# Patient Record
Sex: Female | Born: 1980 | Race: White | Hispanic: Yes | Marital: Married | Smoking: Former smoker
Health system: Southern US, Community
[De-identification: ages and names within clinical notes are randomized; demographics above are authoritative.]

## PROBLEM LIST (undated history)

## (undated) DIAGNOSIS — R5383 Other fatigue: Secondary | ICD-10-CM

## (undated) DIAGNOSIS — C55 Malignant neoplasm of uterus, part unspecified: Secondary | ICD-10-CM

## (undated) DIAGNOSIS — Z8639 Personal history of other endocrine, nutritional and metabolic disease: Secondary | ICD-10-CM

## (undated) DIAGNOSIS — Z862 Personal history of diseases of the blood and blood-forming organs and certain disorders involving the immune mechanism: Secondary | ICD-10-CM

## (undated) DIAGNOSIS — R0602 Shortness of breath: Secondary | ICD-10-CM

## (undated) DIAGNOSIS — C801 Malignant (primary) neoplasm, unspecified: Secondary | ICD-10-CM

## (undated) DIAGNOSIS — N946 Dysmenorrhea, unspecified: Secondary | ICD-10-CM

## (undated) DIAGNOSIS — N840 Polyp of corpus uteri: Secondary | ICD-10-CM

## (undated) DIAGNOSIS — Z5189 Encounter for other specified aftercare: Secondary | ICD-10-CM

## (undated) DIAGNOSIS — D649 Anemia, unspecified: Secondary | ICD-10-CM

## (undated) DIAGNOSIS — R002 Palpitations: Secondary | ICD-10-CM

## (undated) DIAGNOSIS — I10 Essential (primary) hypertension: Secondary | ICD-10-CM

## (undated) HISTORY — DX: Palpitations: R00.2

## (undated) HISTORY — PX: ENDOMETRIAL BIOPSY: SHX622

## (undated) HISTORY — DX: Malignant neoplasm of uterus, part unspecified: C55

## (undated) HISTORY — DX: Encounter for other specified aftercare: Z51.89

## (undated) HISTORY — DX: Polyp of corpus uteri: N84.0

## (undated) HISTORY — DX: Malignant (primary) neoplasm, unspecified: C80.1

## (undated) HISTORY — PX: DILATION AND CURETTAGE OF UTERUS: SHX78

## (undated) HISTORY — DX: Personal history of diseases of the blood and blood-forming organs and certain disorders involving the immune mechanism: Z86.2

## (undated) HISTORY — DX: Dysmenorrhea, unspecified: N94.6

## (undated) HISTORY — DX: Personal history of other endocrine, nutritional and metabolic disease: Z86.39

## (undated) HISTORY — DX: Shortness of breath: R06.02

## (undated) HISTORY — DX: Anemia, unspecified: D64.9

## (undated) HISTORY — DX: Morbid (severe) obesity due to excess calories: E66.01

## (undated) HISTORY — DX: Other fatigue: R53.83

## (undated) HISTORY — DX: Essential (primary) hypertension: I10

---

## 2003-04-30 ENCOUNTER — Ambulatory Visit: Admission: RE | Admit: 2003-04-30 | Discharge: 2003-04-30 | Payer: Self-pay | Admitting: Family Medicine

## 2005-03-18 ENCOUNTER — Emergency Department (HOSPITAL_COMMUNITY): Admission: EM | Admit: 2005-03-18 | Discharge: 2005-03-18 | Payer: Self-pay | Admitting: Emergency Medicine

## 2006-04-29 ENCOUNTER — Emergency Department (HOSPITAL_COMMUNITY): Admission: EM | Admit: 2006-04-29 | Discharge: 2006-04-29 | Payer: Self-pay | Admitting: Emergency Medicine

## 2006-11-08 ENCOUNTER — Emergency Department (HOSPITAL_COMMUNITY): Admission: EM | Admit: 2006-11-08 | Discharge: 2006-11-08 | Payer: Self-pay | Admitting: Emergency Medicine

## 2007-04-03 ENCOUNTER — Emergency Department (HOSPITAL_COMMUNITY): Admission: EM | Admit: 2007-04-03 | Discharge: 2007-04-03 | Payer: Self-pay | Admitting: Family Medicine

## 2008-04-10 ENCOUNTER — Ambulatory Visit (HOSPITAL_COMMUNITY): Admission: RE | Admit: 2008-04-10 | Discharge: 2008-04-10 | Payer: Self-pay | Admitting: Family Medicine

## 2009-03-07 ENCOUNTER — Inpatient Hospital Stay (HOSPITAL_COMMUNITY): Admission: AD | Admit: 2009-03-07 | Discharge: 2009-03-07 | Payer: Self-pay | Admitting: Family Medicine

## 2009-03-13 ENCOUNTER — Ambulatory Visit: Payer: Self-pay | Admitting: Obstetrics and Gynecology

## 2009-03-13 ENCOUNTER — Other Ambulatory Visit: Admission: RE | Admit: 2009-03-13 | Discharge: 2009-03-13 | Payer: Self-pay | Admitting: Obstetrics and Gynecology

## 2009-03-19 ENCOUNTER — Inpatient Hospital Stay (HOSPITAL_COMMUNITY): Admission: AD | Admit: 2009-03-19 | Discharge: 2009-03-21 | Payer: Self-pay | Admitting: Obstetrics & Gynecology

## 2009-03-19 ENCOUNTER — Ambulatory Visit: Payer: Self-pay | Admitting: Obstetrics & Gynecology

## 2009-03-20 ENCOUNTER — Encounter: Payer: Self-pay | Admitting: Obstetrics & Gynecology

## 2009-04-04 ENCOUNTER — Ambulatory Visit: Payer: Self-pay | Admitting: Obstetrics and Gynecology

## 2009-08-18 ENCOUNTER — Emergency Department (HOSPITAL_COMMUNITY): Admission: EM | Admit: 2009-08-18 | Discharge: 2009-08-18 | Payer: Self-pay | Admitting: Emergency Medicine

## 2010-04-29 ENCOUNTER — Emergency Department (HOSPITAL_COMMUNITY)
Admission: EM | Admit: 2010-04-29 | Discharge: 2010-04-29 | Payer: Self-pay | Source: Home / Self Care | Admitting: Emergency Medicine

## 2010-08-01 ENCOUNTER — Ambulatory Visit (HOSPITAL_COMMUNITY): Payer: Self-pay | Attending: Cardiovascular Disease

## 2010-08-01 DIAGNOSIS — R079 Chest pain, unspecified: Secondary | ICD-10-CM | POA: Insufficient documentation

## 2010-08-26 ENCOUNTER — Inpatient Hospital Stay (HOSPITAL_COMMUNITY)
Admission: AD | Admit: 2010-08-26 | Discharge: 2010-08-26 | Disposition: A | Discharge status: Home / Self Care | Payer: Self-pay | Source: Ambulatory Visit | Attending: Obstetrics & Gynecology | Admitting: Obstetrics & Gynecology

## 2010-08-26 ENCOUNTER — Inpatient Hospital Stay (HOSPITAL_COMMUNITY): Payer: Self-pay

## 2010-08-26 DIAGNOSIS — N949 Unspecified condition associated with female genital organs and menstrual cycle: Secondary | ICD-10-CM

## 2010-08-26 DIAGNOSIS — N938 Other specified abnormal uterine and vaginal bleeding: Secondary | ICD-10-CM | POA: Insufficient documentation

## 2010-08-26 DIAGNOSIS — N925 Other specified irregular menstruation: Secondary | ICD-10-CM

## 2010-08-26 LAB — URINE MICROSCOPIC-ADD ON

## 2010-08-26 LAB — URINALYSIS, ROUTINE W REFLEX MICROSCOPIC
Specific Gravity, Urine: 1.02 (ref 1.005–1.030)
Urine Glucose, Fasting: NEGATIVE mg/dL
Urobilinogen, UA: 0.2 mg/dL (ref 0.0–1.0)
pH: 6 (ref 5.0–8.0)

## 2010-08-26 LAB — WET PREP, GENITAL
Clue Cells Wet Prep HPF POC: NONE SEEN
Yeast Wet Prep HPF POC: NONE SEEN

## 2010-08-26 LAB — CBC
MCH: 25.5 pg — ABNORMAL LOW (ref 26.0–34.0)
MCHC: 31.6 g/dL (ref 30.0–36.0)
Platelets: 306 10*3/uL (ref 150–400)
RDW: 15.1 % (ref 11.5–15.5)

## 2010-08-27 LAB — GC/CHLAMYDIA PROBE AMP, GENITAL: GC Probe Amp, Genital: NEGATIVE

## 2010-09-17 ENCOUNTER — Encounter: Payer: Self-pay | Admitting: Obstetrics & Gynecology

## 2010-09-17 LAB — BASIC METABOLIC PANEL
BUN: 9 mg/dL (ref 6–23)
Calcium: 8.8 mg/dL (ref 8.4–10.5)
GFR calc non Af Amer: >60 mL/min (ref >60)
Glucose, Bld: 111 mg/dL — ABNORMAL HIGH (ref 70–99)
Potassium: 3.7 mEq/L (ref 3.5–5.1)

## 2010-09-17 LAB — POCT CARDIAC MARKERS
Myoglobin, poc: 45.2 ng/mL (ref 12–200)
Troponin i, poc: <0.05 ng/mL (ref 0.00–0.09)

## 2010-09-17 LAB — DIFFERENTIAL
Basophils Relative: 0 % (ref 0–1)
Eosinophils Absolute: 0.2 10*3/uL (ref 0.0–0.7)
Monocytes Absolute: 0.7 10*3/uL (ref 0.1–1.0)
Neutrophils Relative %: 68 % (ref 43–77)

## 2010-09-17 LAB — CBC
Hemoglobin: 10.6 g/dL — ABNORMAL LOW (ref 12.0–15.0)
RBC: 4.64 MIL/uL (ref 3.87–5.11)

## 2010-10-03 LAB — CBC
HCT: 27 % — ABNORMAL LOW (ref 36.0–46.0)
HCT: 28.8 % — ABNORMAL LOW (ref 36.0–46.0)
Hemoglobin: 6.4 g/dL — CL (ref 12.0–15.0)
Hemoglobin: 8.6 g/dL — ABNORMAL LOW (ref 12.0–15.0)
Hemoglobin: 8.8 g/dL — ABNORMAL LOW (ref 12.0–15.0)
MCHC: 31.3 g/dL (ref 30.0–36.0)
MCV: 64 fL — ABNORMAL LOW (ref 78.0–100.0)
Platelets: 290 10*3/uL (ref 150–400)
Platelets: 307 10*3/uL (ref 150–400)
Platelets: 308 10*3/uL (ref 150–400)
RBC: 3.19 MIL/uL — ABNORMAL LOW (ref 3.87–5.11)
RBC: 4.04 MIL/uL (ref 3.87–5.11)
RBC: 4.32 MIL/uL (ref 3.87–5.11)
RDW: 23.1 % — ABNORMAL HIGH (ref 11.5–15.5)
RDW: 23.5 % — ABNORMAL HIGH (ref 11.5–15.5)
RDW: 18.6 % — ABNORMAL HIGH (ref 11.5–15.5)
WBC: 11 10*3/uL — ABNORMAL HIGH (ref 4.0–10.5)
WBC: 10.4 10*3/uL (ref 4.0–10.5)

## 2010-10-03 LAB — DIC (DISSEMINATED INTRAVASCULAR COAGULATION)PANEL
D-Dimer, Quant: <0.22 ug/mL-FEU (ref 0.00–0.48)
Platelets: 309 10*3/uL (ref 150–400)
Smear Review: NONE SEEN

## 2010-10-03 LAB — URINALYSIS, ROUTINE W REFLEX MICROSCOPIC
Bilirubin Urine: NEGATIVE
Glucose, UA: NEGATIVE mg/dL
Ketones, ur: NEGATIVE mg/dL
Protein, ur: 30 mg/dL — AB
Urobilinogen, UA: 0.2 mg/dL (ref 0.0–1.0)

## 2010-10-03 LAB — CROSSMATCH

## 2010-10-03 LAB — WET PREP, GENITAL
Clue Cells Wet Prep HPF POC: NONE SEEN
Yeast Wet Prep HPF POC: NONE SEEN

## 2010-10-03 LAB — VON WILLEBRAND PANEL: Factor-VIII Activity: 128 % (ref 50–150)

## 2010-10-03 LAB — GC/CHLAMYDIA PROBE AMP, GENITAL
Chlamydia, DNA Probe: NEGATIVE
GC Probe Amp, Genital: NEGATIVE

## 2010-10-03 LAB — DIFFERENTIAL
Basophils Absolute: 0 10*3/uL (ref 0.0–0.1)
Basophils Relative: 0 % (ref 0–1)
Eosinophils Relative: 1 % (ref 0–5)
Lymphs Abs: 1.7 10*3/uL (ref 0.7–4.0)

## 2010-10-08 ENCOUNTER — Other Ambulatory Visit (HOSPITAL_COMMUNITY): Payer: Self-pay | Admitting: Family Medicine

## 2010-10-08 DIAGNOSIS — Z1231 Encounter for screening mammogram for malignant neoplasm of breast: Secondary | ICD-10-CM

## 2010-10-14 ENCOUNTER — Ambulatory Visit (HOSPITAL_COMMUNITY)
Admission: RE | Admit: 2010-10-14 | Discharge: 2010-10-14 | Disposition: A | Discharge status: Home / Self Care | Payer: Self-pay | Source: Ambulatory Visit | Attending: Family Medicine | Admitting: Family Medicine

## 2010-10-14 DIAGNOSIS — Z1231 Encounter for screening mammogram for malignant neoplasm of breast: Secondary | ICD-10-CM

## 2011-09-03 DIAGNOSIS — R5383 Other fatigue: Secondary | ICD-10-CM | POA: Insufficient documentation

## 2011-11-27 ENCOUNTER — Ambulatory Visit: Payer: Self-pay | Admitting: Physician Assistant

## 2011-11-27 VITALS — BP 124/83 | HR 66 | Temp 97.7°F | Resp 18 | Ht 65.0 in | Wt 331.0 lb

## 2011-11-27 DIAGNOSIS — D2330 Other benign neoplasm of skin of unspecified part of face: Secondary | ICD-10-CM

## 2011-11-27 DIAGNOSIS — L989 Disorder of the skin and subcutaneous tissue, unspecified: Secondary | ICD-10-CM

## 2011-11-27 MED ORDER — DOXYCYCLINE HYCLATE 100 MG PO TABS: 100.0000 mg | ORAL_TABLET | Freq: Two times a day (BID) | ORAL | Status: AC

## 2011-11-27 NOTE — Progress Notes (Signed)
  Subjective:    Patient ID: Casey King, female    DOB: 1980-11-16, 31 y.o.   MRN: [id number]  HPI Casey King comes in today c/o lesion on left forehead that has been present for 4 days.  Lesion started as a small red bump with 2 small "pin holes" in the middle.  The redness spread outward and lesion became hard and slightly raised. She tried to squeeze something out of it but there was no drainage.  She has no fever or chills, itching or other lesions.   Review of Systems As noted in HPI    Objective:   Physical Exam  Lymphadenopathy:       Head (right side): No preauricular, no posterior auricular and no occipital adenopathy present.       Head (left side): Posterior auricular adenopathy present. No preauricular and no occipital adenopathy present.    She has no cervical adenopathy.  Skin:       PHOTO TAKEN      Assessment & Plan:  Skin lesion, ? Cellulitis vs. Insect bite.  Doxy 100 bid x 7 days Hot compresses  Watch for worsening sx.

## 2012-04-25 ENCOUNTER — Other Ambulatory Visit (HOSPITAL_COMMUNITY): Payer: Self-pay | Admitting: Family Medicine

## 2012-07-11 ENCOUNTER — Encounter (HOSPITAL_COMMUNITY): Payer: Self-pay

## 2012-07-11 ENCOUNTER — Emergency Department (HOSPITAL_COMMUNITY): Payer: Self-pay

## 2012-07-11 ENCOUNTER — Emergency Department (HOSPITAL_COMMUNITY)
Admission: EM | Admit: 2012-07-11 | Discharge: 2012-07-11 | Disposition: A | Discharge status: Home / Self Care | Payer: Self-pay | Attending: Emergency Medicine | Admitting: Emergency Medicine

## 2012-07-11 DIAGNOSIS — B9789 Other viral agents as the cause of diseases classified elsewhere: Secondary | ICD-10-CM | POA: Insufficient documentation

## 2012-07-11 DIAGNOSIS — I10 Essential (primary) hypertension: Secondary | ICD-10-CM | POA: Insufficient documentation

## 2012-07-11 DIAGNOSIS — B349 Viral infection, unspecified: Secondary | ICD-10-CM

## 2012-07-11 DIAGNOSIS — Z8679 Personal history of other diseases of the circulatory system: Secondary | ICD-10-CM | POA: Insufficient documentation

## 2012-07-11 DIAGNOSIS — E039 Hypothyroidism, unspecified: Secondary | ICD-10-CM | POA: Insufficient documentation

## 2012-07-11 DIAGNOSIS — Z862 Personal history of diseases of the blood and blood-forming organs and certain disorders involving the immune mechanism: Secondary | ICD-10-CM | POA: Insufficient documentation

## 2012-07-11 MED ORDER — HYDROCOD POLST-CHLORPHEN POLST 10-8 MG/5ML PO LQCR
5.0000 mL | Freq: Once | ORAL | Status: AC
  Administered 2012-07-11: 5 mL via ORAL
  Filled 2012-07-11: qty 5

## 2012-07-11 MED ORDER — HYDROCODONE-HOMATROPINE 5-1.5 MG/5ML PO SYRP: 5.0000 mL | ORAL_SOLUTION | Freq: Four times a day (QID) | ORAL | Status: DC | PRN

## 2012-07-11 NOTE — ED Notes (Signed)
Seen by Dr. Narda Bonds and DX with flu, rx w/Tamiflu and mucinex. Pt here because of increased chest pain. Can't cough up anything

## 2012-07-11 NOTE — Discharge Instructions (Signed)
Chest x-ray shows no pneumonia.  Increase fluids. Tylenol for fever. Cough syrup prescription

## 2012-07-11 NOTE — ED Provider Notes (Signed)
History     CSN: [id number]  Arrival date & time 07/11/12  1946   First MD Initiated Contact with Patient 07/11/12 2023      Chief Complaint  Patient presents with  . Influenza    (Consider location/radiation/quality/duration/timing/severity/associated sxs/prior treatment) HPI....coughing, weakness, muscular aches, headache for approximately one week, worse the past 24 hours.  Patient has not been drinking fluids.  Nothing makes symptoms better or worse. Severity is mild to moderate. No chest pain, dyspnea, chills, stiff neck  Past Medical History  Diagnosis Date  . Morbid obesity   . Hypertension   . Anemia   . Palpitations   . History of hypothyroidism   . Chest pain   . SOB (shortness of breath)   . Fatigue     History reviewed. No pertinent past surgical history.  Family History  Problem Relation Age of Onset  . Cancer Mother     Breast Cancer  . Hypertension Father   . Diabetes Father     History  Substance Use Topics  . Smoking status: Never Smoker   . Smokeless tobacco: Not on file  . Alcohol Use: No    OB History    Grav Para Term Preterm Abortions TAB SAB Ect Mult Living                  Review of Systems  All other systems reviewed and are negative.    Allergies  Review of patient's allergies indicates no known allergies.  Home Medications   Current Outpatient Rx  Name  Route  Sig  Dispense  Refill  . IBUPROFEN 200 MG PO TABS   Oral   Take 800 mg by mouth daily as needed. FOR PAIN         . METOPROLOL SUCCINATE ER 50 MG PO TB24   Oral   Take 50 mg by mouth every 12 (twelve) hours. Take with or immediately following a meal.         . OSELTAMIVIR PHOSPHATE 75 MG PO CAPS   Oral   Take 75 mg by mouth 2 (two) times daily. FOR 5 DAYS         . HYDROCODONE-HOMATROPINE 5-1.5 MG/5ML PO SYRP   Oral   Take 5 mLs by mouth every 6 (six) hours as needed for cough.   90 mL   0     BP 144/97  Pulse 80  Temp 98.5 F (36.9 C) (Oral)   Resp 20  Ht 5\' 5"  (1.651 m)  Wt 320 lb (145.151 kg)  BMI 53.25 kg/m2  SpO2 99%  LMP 06/26/2012  Physical Exam  Nursing note and vitals reviewed. Constitutional: She is oriented to person, place, and time.       Obese, no acute distress  HENT:  Head: Normocephalic and atraumatic.  Eyes: Conjunctivae normal and EOM are normal. Pupils are equal, round, and reactive to light.  Neck: Normal range of motion. Neck supple.  Cardiovascular: Normal rate, regular rhythm and normal heart sounds.   Pulmonary/Chest: Effort normal and breath sounds normal.  Abdominal: Soft. Bowel sounds are normal.  Musculoskeletal: Normal range of motion.  Neurological: She is alert and oriented to person, place, and time.  Skin: Skin is warm and dry.  Psychiatric: She has a normal mood and affect.    ED Course  Procedures (including critical care time)  Labs Reviewed - No data to display Dg Chest 2 View  07/11/2012  *RADIOLOGY REPORT*  Clinical Data: Cough, chest pain,  shortness of breath.  CHEST - 2 VIEW  Comparison: 08/18/2009  Findings: Lungs clear.  Heart size and pulmonary vascularity normal.  No effusion.  Visualized bones unremarkable.  IMPRESSION: No acute disease   Original Report Authenticated By: D. Andria Rhein, MD      1. Viral syndrome       MDM  Chest x-ray reveals no pneumonia. History and physical consistent with viral syndrome. Rx Hycodan for cough.  Increase fluids. Tylenol for fever        Donnetta Hutching, MD 07/11/12 2141

## 2012-08-23 ENCOUNTER — Encounter: Payer: Self-pay | Admitting: Cardiovascular Disease

## 2013-02-24 ENCOUNTER — Emergency Department (HOSPITAL_COMMUNITY)
Admission: EM | Admit: 2013-02-24 | Discharge: 2013-02-24 | Disposition: A | Discharge status: Home / Self Care | Payer: Self-pay | Attending: Emergency Medicine | Admitting: Emergency Medicine

## 2013-02-24 ENCOUNTER — Encounter (HOSPITAL_COMMUNITY): Payer: Self-pay | Admitting: *Deleted

## 2013-02-24 DIAGNOSIS — S8011XA Contusion of right lower leg, initial encounter: Secondary | ICD-10-CM

## 2013-02-24 DIAGNOSIS — I10 Essential (primary) hypertension: Secondary | ICD-10-CM | POA: Insufficient documentation

## 2013-02-24 DIAGNOSIS — Z79899 Other long term (current) drug therapy: Secondary | ICD-10-CM | POA: Insufficient documentation

## 2013-02-24 DIAGNOSIS — Z8709 Personal history of other diseases of the respiratory system: Secondary | ICD-10-CM | POA: Insufficient documentation

## 2013-02-24 DIAGNOSIS — M79609 Pain in unspecified limb: Secondary | ICD-10-CM | POA: Insufficient documentation

## 2013-02-24 DIAGNOSIS — Z862 Personal history of diseases of the blood and blood-forming organs and certain disorders involving the immune mechanism: Secondary | ICD-10-CM | POA: Insufficient documentation

## 2013-02-24 DIAGNOSIS — Z8679 Personal history of other diseases of the circulatory system: Secondary | ICD-10-CM | POA: Insufficient documentation

## 2013-02-24 DIAGNOSIS — I8390 Asymptomatic varicose veins of unspecified lower extremity: Secondary | ICD-10-CM | POA: Insufficient documentation

## 2013-02-24 DIAGNOSIS — Z8639 Personal history of other endocrine, nutritional and metabolic disease: Secondary | ICD-10-CM | POA: Insufficient documentation

## 2013-02-24 DIAGNOSIS — M79604 Pain in right leg: Secondary | ICD-10-CM

## 2013-02-24 MED ORDER — MELOXICAM 7.5 MG PO TABS: 7.5000 mg | ORAL_TABLET | Freq: Every day | ORAL | Status: DC

## 2013-02-24 NOTE — ED Provider Notes (Signed)
Pt presents with RLE pain on the lateral surface of the mid leg.  Sx are persistent, they were acute in onset.  No fever, no SOB, mild swelling and bruising at the site and around varicose veins.  Likely ruptured varicosity, not DVT  Nsaids, ice packs, f/u PRN.  Medical screening examination/treatment/procedure(s) were conducted as a shared visit with non-physician practitioner(s) and myself.  I personally evaluated the patient during the encounter.  Clinical Impression: ruptured  Varicose vein.     Vida Roller, MD 02/24/13 [id number]

## 2013-02-24 NOTE — ED Notes (Signed)
Pain rt lower lateral leg, onset today when she felt a sudden burning in leg.  Has superficial vein swelling /discomfort.rt lower leg.  No injury

## 2013-02-24 NOTE — ED Notes (Signed)
States she felt a knot in her right lower leg, no know  injury

## 2013-02-24 NOTE — ED Provider Notes (Signed)
CSN: [id number]     Arrival date & time 02/24/13  1455 History   First MD Initiated Contact with Patient 02/24/13 1515     Chief Complaint  Patient presents with  . Leg Pain   (Consider location/radiation/quality/duration/timing/severity/associated sxs/prior Treatment) Patient is a 32 y.o. female presenting with leg pain. The history is provided by the patient.  Leg Pain Location:  Leg Injury: no   Leg location:  R lower leg Pain details:    Quality:  Aching and sharp   Severity:  Moderate   Onset quality:  Sudden   Duration:  1 hour   Timing:  Constant   Progression:  Unchanged Chronicity:  New Dislocation: no   Foreign body present:  No foreign bodies Prior injury to area:  No Worsened by:  Nothing tried Ineffective treatments:  None tried Associated symptoms: no fever and no neck pain    Casey King is a 32 y.o. female who presents to the ED with a know to the right lower leg. There was a sudden onset of burning pain and then she noted bruising to the lateral aspect of the leg.   Past Medical History  Diagnosis Date  . Morbid obesity   . Hypertension   . Anemia   . Palpitations   . History of hypothyroidism   . Chest pain   . SOB (shortness of breath)   . Fatigue    History reviewed. No pertinent past surgical history. Family History  Problem Relation Age of Onset  . Cancer Mother     Breast Cancer  . Hypertension Father   . Diabetes Father    History  Substance Use Topics  . Smoking status: Never Smoker   . Smokeless tobacco: Not on file  . Alcohol Use: No   OB History   Grav Para Term Preterm Abortions TAB SAB Ect Mult Living                 Review of Systems  Constitutional: Negative for fever and chills.  HENT: Negative for neck pain.   Respiratory: Negative for shortness of breath.   Cardiovascular: Negative for chest pain.  Musculoskeletal: Negative for joint swelling.       Leg pain  Skin: Negative for wound.  Psychiatric/Behavioral:  The patient is not nervous/anxious.     Allergies  Review of patient's allergies indicates no known allergies.  Home Medications   Current Outpatient Rx  Name  Route  Sig  Dispense  Refill  . HYDROcodone-homatropine (HYCODAN) 5-1.5 MG/5ML syrup   Oral   Take 5 mLs by mouth every 6 (six) hours as needed for cough.   90 mL   0   . ibuprofen (ADVIL,MOTRIN) 200 MG tablet   Oral   Take 800 mg by mouth daily as needed. FOR PAIN         . metoprolol succinate (TOPROL-XL) 50 MG 24 hr tablet   Oral   Take 50 mg by mouth every 12 (twelve) hours. Take with or immediately following a meal.         . oseltamivir (TAMIFLU) 75 MG capsule   Oral   Take 75 mg by mouth 2 (two) times daily. FOR 5 DAYS          BP 142/106  Pulse 64  Temp(Src) 98.4 F (36.9 C) (Oral)  Resp 20  Ht 5\' 5"  (1.651 m)  Wt 308 lb (139.708 kg)  BMI 51.25 kg/m2  SpO2 100% Physical Exam  Nursing note  and vitals reviewed. Constitutional: She is oriented to person, place, and time. She appears well-developed and well-nourished. No distress.  HENT:  Head: Normocephalic.  Eyes: EOM are normal.  Neck: Neck supple.  Cardiovascular: Normal rate.   Pulmonary/Chest: Effort normal.  Musculoskeletal:       Right lower leg: She exhibits tenderness.       Legs: Ecchymosis noted lateral aspect of the right lower leg with tenderness of the area on palpation. No calf pain. Adequate circulation, good pulses, good touch sensation.   Neurological: She is alert and oriented to person, place, and time. She has normal strength. No cranial nerve deficit or sensory deficit. Gait normal.  Skin: Skin is warm and dry.  Psychiatric: She has a normal mood and affect. Her behavior is normal.    ED Course  Procedures   MDM  32 y.o. female with ecchymosis of right lateral lower leg. Will treat with Mobic and she will follow up wit her PCP. No concern for DVT at this time. Patient stable for discharge without any immediate  complications.  Discussed with the patient and all questioned fully answered. She will return if any problems arise.     [facility] Orlene Och, [place] 02/24/13 1539

## 2013-02-25 NOTE — ED Provider Notes (Signed)
Medical screening examination/treatment/procedure(s) were conducted as a shared visit with non-physician practitioner(s) and myself.  I personally evaluated the patient during the encounter  Please see my separate respective documentation pertaining to this patient encounter   Vida Roller, MD 02/25/13 [phone]

## 2013-07-11 ENCOUNTER — Encounter (HOSPITAL_COMMUNITY): Payer: Self-pay | Admitting: *Deleted

## 2013-07-11 ENCOUNTER — Ambulatory Visit: Payer: Self-pay | Admitting: Adult Health

## 2013-07-11 ENCOUNTER — Inpatient Hospital Stay (HOSPITAL_COMMUNITY): Payer: Self-pay

## 2013-07-11 ENCOUNTER — Ambulatory Visit: Payer: Self-pay | Admitting: Obstetrics & Gynecology

## 2013-07-11 ENCOUNTER — Observation Stay (HOSPITAL_COMMUNITY)
Admission: AD | Admit: 2013-07-11 | Discharge: 2013-07-12 | Disposition: A | Discharge status: Home / Self Care | Payer: Self-pay | Source: Ambulatory Visit | Attending: Obstetrics & Gynecology | Admitting: Obstetrics & Gynecology

## 2013-07-11 DIAGNOSIS — R42 Dizziness and giddiness: Secondary | ICD-10-CM | POA: Insufficient documentation

## 2013-07-11 DIAGNOSIS — N938 Other specified abnormal uterine and vaginal bleeding: Secondary | ICD-10-CM | POA: Insufficient documentation

## 2013-07-11 DIAGNOSIS — D649 Anemia, unspecified: Secondary | ICD-10-CM

## 2013-07-11 DIAGNOSIS — N920 Excessive and frequent menstruation with regular cycle: Principal | ICD-10-CM | POA: Diagnosis present

## 2013-07-11 DIAGNOSIS — N949 Unspecified condition associated with female genital organs and menstrual cycle: Secondary | ICD-10-CM | POA: Insufficient documentation

## 2013-07-11 DIAGNOSIS — N939 Abnormal uterine and vaginal bleeding, unspecified: Secondary | ICD-10-CM

## 2013-07-11 DIAGNOSIS — N925 Other specified irregular menstruation: Secondary | ICD-10-CM

## 2013-07-11 LAB — CBC
HEMATOCRIT: 28.1 % — AB (ref 36.0–46.0)
Hemoglobin: 9.5 g/dL — ABNORMAL LOW (ref 12.0–15.0)
MCH: 26.5 pg (ref 26.0–34.0)
MCHC: 33.8 g/dL (ref 30.0–36.0)
MCV: 78.5 fL (ref 78.0–100.0)
Platelets: 257 10*3/uL (ref 150–400)
RBC: 3.58 MIL/uL — ABNORMAL LOW (ref 3.87–5.11)
RDW: 14.2 % (ref 11.5–15.5)
WBC: 8.4 10*3/uL (ref 4.0–10.5)

## 2013-07-11 LAB — HEMOGLOBIN AND HEMATOCRIT, BLOOD
HEMATOCRIT: 26.7 % — AB (ref 36.0–46.0)
Hemoglobin: 8.9 g/dL — ABNORMAL LOW (ref 12.0–15.0)

## 2013-07-11 MED ORDER — DIPHENHYDRAMINE HCL 25 MG PO CAPS
25.0000 mg | ORAL_CAPSULE | Freq: Once | ORAL | Status: AC
  Administered 2013-07-12: 25 mg via ORAL
  Filled 2013-07-11: qty 1

## 2013-07-11 MED ORDER — LACTATED RINGERS IV SOLN
INTRAVENOUS | Status: DC
Start: 2013-07-11 — End: 2013-07-12
  Administered 2013-07-12: 01:00:00 via INTRAVENOUS

## 2013-07-11 MED ORDER — ACETAMINOPHEN 325 MG PO TABS
650.0000 mg | ORAL_TABLET | Freq: Once | ORAL | Status: AC
  Administered 2013-07-12: 650 mg via ORAL
  Filled 2013-07-11: qty 2

## 2013-07-11 MED ORDER — MEGESTROL ACETATE 40 MG PO TABS
160.0000 mg | ORAL_TABLET | ORAL | Status: AC
  Administered 2013-07-11: 160 mg via ORAL
  Filled 2013-07-11: qty 4

## 2013-07-11 MED ORDER — LACTATED RINGERS IV BOLUS (SEPSIS): 1000.0000 mL | Freq: Once | INTRAVENOUS | Status: DC

## 2013-07-11 MED ORDER — IBUPROFEN 600 MG PO TABS: 600.0000 mg | ORAL_TABLET | Freq: Four times a day (QID) | ORAL | Status: DC | PRN

## 2013-07-11 MED ORDER — LACTATED RINGERS IV SOLN
INTRAVENOUS | Status: DC
  Administered 2013-07-11 (×2): via INTRAVENOUS

## 2013-07-11 MED ORDER — SIMETHICONE 80 MG PO CHEW
80.0000 mg | CHEWABLE_TABLET | Freq: Four times a day (QID) | ORAL | Status: DC | PRN
Start: 2013-07-11 — End: 2013-07-12

## 2013-07-11 MED ORDER — TRAMADOL HCL 50 MG PO TABS: 50.0000 mg | ORAL_TABLET | Freq: Four times a day (QID) | ORAL | Status: DC | PRN

## 2013-07-11 MED ORDER — MEGESTROL ACETATE 40 MG PO TABS
40.0000 mg | ORAL_TABLET | Freq: Three times a day (TID) | ORAL | Status: DC
  Administered 2013-07-12 (×2): 40 mg via ORAL
  Filled 2013-07-11 (×2): qty 1

## 2013-07-11 MED ORDER — LACTATED RINGERS IV SOLN
INTRAVENOUS | Status: DC
  Administered 2013-07-11 (×2): via INTRAVENOUS

## 2013-07-11 NOTE — MAU Note (Signed)
C/o bleeding for past 2 days ; progressively getting worse- heavy bleeding with large clots and severe cramping;

## 2013-07-11 NOTE — H&P (Signed)
Chief Complaint: Vaginal Bleeding   First Provider Initiated Contact with Patient 07/11/13 1557     SUBJECTIVE HPI: Casey King is a 33 y.o. G0P0 who presents to maternity admissions reporting heavy vaginal bleeding starting yesterday.  She reports her periods are usually irregular, sometimes heavy, but she is soaking 1 super tampon every 20 minutes today, reports she has had to wear a towel in addition to the tampon so she would not soak her clothes. She has seen large clots bigger than golf-ball sized today.  A few years ago she had this happen and was admitted to the hospital for a D&C and 4 units of blood.  She reports some fatigue but denies dizziness, chest pain, or palpitations.  She denies vaginal itching/burning, urinary symptoms, h/a, n/v, or fever/chills.     Past Medical History  Diagnosis Date  . Morbid obesity   . Hypertension   . Anemia   . Palpitations   . History of hypothyroidism   . Chest pain   . SOB (shortness of breath)   . Fatigue    Past Surgical History  Procedure Laterality Date  . No past surgeries     History   Social History  . Marital Status: Married    Spouse Name: N/A    Number of Children: N/A  . Years of Education: N/A   Occupational History  . Not on file.   Social History Main Topics  . Smoking status: Never Smoker   . Smokeless tobacco: Not on file  . Alcohol Use: No  . Drug Use: No  . Sexual Activity: Not on file   Other Topics Concern  . Not on file   Social History Narrative  . No narrative on file   No current facility-administered medications on file prior to encounter.   Current Outpatient Prescriptions on File Prior to Encounter  Medication Sig Dispense Refill  . metoprolol succinate (TOPROL-XL) 50 MG 24 hr tablet Take 25 mg by mouth every 12 (twelve) hours. Take with or immediately following a meal.       No Known Allergies  ROS: Pertinent items in HPI  OBJECTIVE Blood pressure 118/82, pulse 81, temperature  98.2 F (36.8 C), temperature source Oral, resp. rate 20.  Patient Vitals for the past 24 hrs:  BP Temp Temp src Pulse Resp  07/11/13 1947 118/82 mmHg - - 81 20  07/11/13 1941 115/58 mmHg - - 65 20  07/11/13 1935 110/73 mmHg - - 66 20  07/11/13 1736 109/52 mmHg - - 57 20  07/11/13 1726 111/58 mmHg - - 59 -  07/11/13 1526 117/78 mmHg 98.2 F (36.8 C) Oral - 66   GENERAL: Well-developed, well-nourished female in mild distress.  HEENT: Normocephalic HEART: normal rate, rhythm, heart sounds RESP: normal effort, lung sounds clear and equal bilaterally ABDOMEN: Soft, non-tender EXTREMITIES: Nontender, no edema NEURO: Alert and oriented Pelvic exam: Cervix pink, visually closed, without lesion, large amount of bright/dark red bleeding with golf-ball and larger sized clots x3-4 expressed during pelvic exam, vaginal walls and external genitalia normal Bimanual exam: Cervix 0/long/high, firm, anterior, neg CMT, uterus  Mildly tender, slightly enlarged, difficult to palpate uterus and adnexa r/t body habitus  Soaked 1 pad/hour in MAU  LAB RESULTS Results for orders placed during the hospital encounter of 07/11/13 (from the past 24 hour(s))  CBC     Status: Abnormal   Collection Time    07/11/13  3:55 PM      Result Value Range  WBC 8.4  4.0 - 10.5 K/uL   RBC 3.58 (*) 3.87 - 5.11 MIL/uL   Hemoglobin 9.5 (*) 12.0 - 15.0 g/dL   HCT 28.1 (*) 36.0 - 46.0 %   MCV 78.5  78.0 - 100.0 fL   MCH 26.5  26.0 - 34.0 pg   MCHC 33.8  30.0 - 36.0 g/dL   RDW 14.2  11.5 - 15.5 %   Platelets 257  150 - 400 K/uL  HEMOGLOBIN AND HEMATOCRIT, BLOOD     Status: Abnormal   Collection Time    07/11/13  5:47 PM      Result Value Range   Hemoglobin 8.9 (*) 12.0 - 15.0 g/dL   HCT 26.7 (*) 36.0 - 46.0 %    IMAGING US Transvaginal Non-ob  07/11/2013   CLINICAL DATA:  Heavy vaginal bleeding.  EXAM: TRANSABDOMINAL AND TRANSVAGINAL ULTRASOUND OF PELVIS  TECHNIQUE: Both transabdominal and transvaginal ultrasound  examinations of the pelvis were performed. Transabdominal technique was performed for global imaging of the pelvis including uterus, ovaries, adnexal regions, and pelvic cul-de-sac. It was necessary to proceed with endovaginal exam following the transabdominal exam to visualize the endometrium and adnexa.  COMPARISON:  Pelvic ultrasound 08/26/2010.  FINDINGS: Uterus  Measurements: 9.5 x 5.4 x 6.7 cm. No fibroids or other mass visualized.  Endometrium  Thickness: Heterogeneous and abnormally thickened at 2.3 cm. There is flow within the contents of the endometrial canal on Doppler imaging. No focal abnormality visualized.  Right ovary  Measurements: Not visualized.  Left ovary  Measurements: Not visualized.  Other findings  A small to moderate volume of simple appearing free pelvic fluid is identified.  IMPRESSION: Abnormally thickened endometrium with blood flow within the endometrium on Doppler imaging. Consider endometrial biopsy.  The ovaries are not visualized.  Small to moderate volume of simple appearing free pelvic fluid.   Electronically Signed   By: Inge Rise M.D.   On: 07/11/2013 18:57   US Pelvis Complete  07/11/2013   CLINICAL DATA:  Heavy vaginal bleeding.  EXAM: TRANSABDOMINAL AND TRANSVAGINAL ULTRASOUND OF PELVIS  TECHNIQUE: Both transabdominal and transvaginal ultrasound examinations of the pelvis were performed. Transabdominal technique was performed for global imaging of the pelvis including uterus, ovaries, adnexal regions, and pelvic cul-de-sac. It was necessary to proceed with endovaginal exam following the transabdominal exam to visualize the endometrium and adnexa.  COMPARISON:  Pelvic ultrasound 08/26/2010.  FINDINGS: Uterus  Measurements: 9.5 x 5.4 x 6.7 cm. No fibroids or other mass visualized.  Endometrium  Thickness: Heterogeneous and abnormally thickened at 2.3 cm. There is flow within the contents of the endometrial canal on Doppler imaging. No focal abnormality visualized.   Right ovary  Measurements: Not visualized.  Left ovary  Measurements: Not visualized.  Other findings  A small to moderate volume of simple appearing free pelvic fluid is identified.  IMPRESSION: Abnormally thickened endometrium with blood flow within the endometrium on Doppler imaging. Consider endometrial biopsy.  The ovaries are not visualized.  Small to moderate volume of simple appearing free pelvic fluid.   Electronically Signed   By: Inge Rise M.D.   On: 07/11/2013 18:57    ASSESSMENT 1. Abnormal uterine bleeding (AUB)   2. Menorrhagia   3. Postural dizziness     PLAN LR x2000 ml in MAU Consult Dr Gala Romney.  Dr Gala Romney to bedside to evaluate pt. Megace 160 mg PO ordered     Medication List    ASK your doctor about these medications  KRILL OIL PO  Take 1 capsule by mouth daily.     metoprolol succinate 50 MG 24 hr tablet  Commonly known as:  TOPROL-XL  Take 25 mg by mouth every 12 (twelve) hours. Take with or immediately following a meal.     montelukast 10 MG tablet  Commonly known as:  SINGULAIR  Take 10 mg by mouth at bedtime.         Fatima Blank Certified Nurse-Midwife 07/11/2013  8:12 PM    Pt seen and examined.  Pt's pad is 1/4 soaked after past 2 hours.  Pt is pale and feels dizzy.  Will admit patient for 2 units PRBC and Megace.  Pt needs out patient endometrial biopsy and needs help with signing up for insurance through the exchanges.  Will consult SW to help with this.  Will also check TSH (history of hyperthroid) and prolactin.  Hgb A1c  Arzell Mcgeehan H.

## 2013-07-11 NOTE — MAU Provider Note (Addendum)
Chief Complaint: Vaginal Bleeding   First Provider Initiated Contact with Patient 07/11/13 1557     SUBJECTIVE HPI: Casey King is a 33 y.o. G0P0 who presents to maternity admissions reporting heavy vaginal bleeding starting yesterday.  She reports her periods are usually irregular, sometimes heavy, but she is soaking 1 super tampon every 20 minutes today, reports she has had to wear a towel in addition to the tampon so she would not soak her clothes. She has seen large clots bigger than golf-ball sized today.  A few years ago she had this happen and was admitted to the hospital for a D&C and 4 units of blood.  She reports some fatigue but denies dizziness, chest pain, or palpitations.  She denies vaginal itching/burning, urinary symptoms, h/a, n/v, or fever/chills.     Past Medical History  Diagnosis Date  . Morbid obesity   . Hypertension   . Anemia   . Palpitations   . History of hypothyroidism   . Chest pain   . SOB (shortness of breath)   . Fatigue    Past Surgical History  Procedure Laterality Date  . No past surgeries     History   Social History  . Marital Status: Married    Spouse Name: N/A    Number of Children: N/A  . Years of Education: N/A   Occupational History  . Not on file.   Social History Main Topics  . Smoking status: Never Smoker   . Smokeless tobacco: Not on file  . Alcohol Use: No  . Drug Use: No  . Sexual Activity: Not on file   Other Topics Concern  . Not on file   Social History Narrative  . No narrative on file   No current facility-administered medications on file prior to encounter.   Current Outpatient Prescriptions on File Prior to Encounter  Medication Sig Dispense Refill  . metoprolol succinate (TOPROL-XL) 50 MG 24 hr tablet Take 25 mg by mouth every 12 (twelve) hours. Take with or immediately following a meal.       No Known Allergies  ROS: Pertinent items in HPI  OBJECTIVE Blood pressure 118/82, pulse 81, temperature  98.2 F (36.8 C), temperature source Oral, resp. rate 20.  Patient Vitals for the past 24 hrs:  BP Temp Temp src Pulse Resp  07/11/13 1947 118/82 mmHg - - 81 20  07/11/13 1941 115/58 mmHg - - 65 20  07/11/13 1935 110/73 mmHg - - 66 20  07/11/13 1736 109/52 mmHg - - 57 20  07/11/13 1726 111/58 mmHg - - 59 -  07/11/13 1526 117/78 mmHg 98.2 F (36.8 C) Oral - 66   GENERAL: Well-developed, well-nourished female in mild distress.  HEENT: Normocephalic HEART: normal rate, rhythm, heart sounds RESP: normal effort, lung sounds clear and equal bilaterally ABDOMEN: Soft, non-tender EXTREMITIES: Nontender, no edema NEURO: Alert and oriented Pelvic exam: Cervix pink, visually closed, without lesion, large amount of bright/dark red bleeding with golf-ball and larger sized clots x3-4 expressed during pelvic exam, vaginal walls and external genitalia normal Bimanual exam: Cervix 0/long/high, firm, anterior, neg CMT, uterus  Mildly tender, slightly enlarged, difficult to palpate uterus and adnexa r/t body habitus  Soaked 1 pad/hour in MAU  LAB RESULTS Results for orders placed during the hospital encounter of 07/11/13 (from the past 24 hour(s))  CBC     Status: Abnormal   Collection Time    07/11/13  3:55 PM      Result Value Range  WBC 8.4  4.0 - 10.5 K/uL   RBC 3.58 (*) 3.87 - 5.11 MIL/uL   Hemoglobin 9.5 (*) 12.0 - 15.0 g/dL   HCT 28.1 (*) 36.0 - 46.0 %   MCV 78.5  78.0 - 100.0 fL   MCH 26.5  26.0 - 34.0 pg   MCHC 33.8  30.0 - 36.0 g/dL   RDW 14.2  11.5 - 15.5 %   Platelets 257  150 - 400 K/uL  HEMOGLOBIN AND HEMATOCRIT, BLOOD     Status: Abnormal   Collection Time    07/11/13  5:47 PM      Result Value Range   Hemoglobin 8.9 (*) 12.0 - 15.0 g/dL   HCT 26.7 (*) 36.0 - 46.0 %    IMAGING US Transvaginal Non-ob  07/11/2013   CLINICAL DATA:  Heavy vaginal bleeding.  EXAM: TRANSABDOMINAL AND TRANSVAGINAL ULTRASOUND OF PELVIS  TECHNIQUE: Both transabdominal and transvaginal ultrasound  examinations of the pelvis were performed. Transabdominal technique was performed for global imaging of the pelvis including uterus, ovaries, adnexal regions, and pelvic cul-de-sac. It was necessary to proceed with endovaginal exam following the transabdominal exam to visualize the endometrium and adnexa.  COMPARISON:  Pelvic ultrasound 08/26/2010.  FINDINGS: Uterus  Measurements: 9.5 x 5.4 x 6.7 cm. No fibroids or other mass visualized.  Endometrium  Thickness: Heterogeneous and abnormally thickened at 2.3 cm. There is flow within the contents of the endometrial canal on Doppler imaging. No focal abnormality visualized.  Right ovary  Measurements: Not visualized.  Left ovary  Measurements: Not visualized.  Other findings  A small to moderate volume of simple appearing free pelvic fluid is identified.  IMPRESSION: Abnormally thickened endometrium with blood flow within the endometrium on Doppler imaging. Consider endometrial biopsy.  The ovaries are not visualized.  Small to moderate volume of simple appearing free pelvic fluid.   Electronically Signed   By: Inge Rise M.D.   On: 07/11/2013 18:57   US Pelvis Complete  07/11/2013   CLINICAL DATA:  Heavy vaginal bleeding.  EXAM: TRANSABDOMINAL AND TRANSVAGINAL ULTRASOUND OF PELVIS  TECHNIQUE: Both transabdominal and transvaginal ultrasound examinations of the pelvis were performed. Transabdominal technique was performed for global imaging of the pelvis including uterus, ovaries, adnexal regions, and pelvic cul-de-sac. It was necessary to proceed with endovaginal exam following the transabdominal exam to visualize the endometrium and adnexa.  COMPARISON:  Pelvic ultrasound 08/26/2010.  FINDINGS: Uterus  Measurements: 9.5 x 5.4 x 6.7 cm. No fibroids or other mass visualized.  Endometrium  Thickness: Heterogeneous and abnormally thickened at 2.3 cm. There is flow within the contents of the endometrial canal on Doppler imaging. No focal abnormality visualized.   Right ovary  Measurements: Not visualized.  Left ovary  Measurements: Not visualized.  Other findings  A small to moderate volume of simple appearing free pelvic fluid is identified.  IMPRESSION: Abnormally thickened endometrium with blood flow within the endometrium on Doppler imaging. Consider endometrial biopsy.  The ovaries are not visualized.  Small to moderate volume of simple appearing free pelvic fluid.   Electronically Signed   By: Inge Rise M.D.   On: 07/11/2013 18:57    ASSESSMENT 1. Abnormal uterine bleeding (AUB)   2. Menorrhagia   3. Postural dizziness     PLAN LR x2000 ml in MAU Consult Dr Gala Romney.  Dr Gala Romney to bedside to evaluate pt. Megace 160 mg PO ordered     Medication List    ASK your doctor about these medications  KRILL OIL PO  Take 1 capsule by mouth daily.     metoprolol succinate 50 MG 24 hr tablet  Commonly known as:  TOPROL-XL  Take 25 mg by mouth every 12 (twelve) hours. Take with or immediately following a meal.     montelukast 10 MG tablet  Commonly known as:  SINGULAIR  Take 10 mg by mouth at bedtime.         Fatima Blank Certified Nurse-Midwife 07/11/2013  8:12 PM    Pt seen and examined.  Pt's pad is 1/4 soaked after past 2 hours.  Pt is pale and feels dizzy.  Will admit patient for 2 units PRBC and Megace.  Pt needs out patient endometrial biopsy and needs help with signing up for insurance through the exchanges.  Will consult SW to help with this.  Chelise Hanger H.

## 2013-07-12 LAB — HEMOGLOBIN A1C
Hgb A1c MFr Bld: 5.6 % (ref <5.7)
Mean Plasma Glucose: 114 mg/dL (ref <117)

## 2013-07-12 LAB — CBC
HEMATOCRIT: 30.2 % — AB (ref 36.0–46.0)
Hemoglobin: 9.9 g/dL — ABNORMAL LOW (ref 12.0–15.0)
MCH: 26.8 pg (ref 26.0–34.0)
MCHC: 32.8 g/dL (ref 30.0–36.0)
MCV: 81.6 fL (ref 78.0–100.0)
Platelets: 234 10*3/uL (ref 150–400)
RBC: 3.7 MIL/uL — ABNORMAL LOW (ref 3.87–5.11)
RDW: 14.9 % (ref 11.5–15.5)
WBC: 6.9 10*3/uL (ref 4.0–10.5)

## 2013-07-12 LAB — TSH: TSH: 2.918 u[IU]/mL (ref 0.350–4.500)

## 2013-07-12 LAB — PROLACTIN: Prolactin: 6.5 ng/mL

## 2013-07-12 LAB — PREPARE RBC (CROSSMATCH)

## 2013-07-12 MED ORDER — MEGESTROL ACETATE 20 MG PO TABS
ORAL_TABLET | ORAL | Status: DC
Start: 2013-07-12 — End: 2013-08-19

## 2013-07-12 MED ORDER — METOPROLOL SUCCINATE ER 25 MG PO TB24
25.0000 mg | ORAL_TABLET | Freq: Two times a day (BID) | ORAL | Status: DC
  Administered 2013-07-12 (×2): 25 mg via ORAL
  Filled 2013-07-12 (×2): qty 1

## 2013-07-12 MED ORDER — MEGESTROL ACETATE 40 MG PO TABS: ORAL_TABLET | ORAL | Status: DC

## 2013-07-12 NOTE — Discharge Summary (Signed)
Physician Discharge Summary  Patient ID: Casey King MRN: [id number] DOB/AGE: [birth year] 33 y.o.  Admit date: 07/11/2013 Discharge date: 07/12/2013  Admission Diagnoses:  33 yo anovulatory female with menorrhagia and symptomatic anemia  Discharge Diagnoses: Sme Active Problems:   Menorrhagia   Discharged Condition: good  Hospital Course: 33 yo female was admitted with menorrhagia and symptomatic anemia.  Pt has long history of anovulatory cycles and menorrhagia.  Pt has not had good follow up due to insurance reasons. Pt has been worked up [employer]: None  Significant Diagnostic Studies: labs: cbc, type and cross blood products, TVUS  Treatments: Blood transfusion x2 units; Megace.  Discharge Exam: Blood pressure 109/71, pulse 62, temperature 98.2 F (36.8 C), temperature source Oral, resp. rate 18, height 5\' 5"  (1.651 m), weight 304 lb (137.893 kg), SpO2 99.00%. General appearance: alert, cooperative and no distress GI: soft, NT, ND Pelvic: brown staining on pad Extremities: Homans sign is negative, no sign of DVT  Disposition: 01-Home or Self Care     Medication List         KRILL OIL PO  Take 1 capsule by mouth daily.     megestrol 40 MG tablet  Commonly known as:  MEGACE  Take 1 tablet by mouth 3 times a day for 3 days, then 2 times a day for 2 days, then once a day until you see the MD.     metoprolol succinate 50 MG 24 hr tablet  Commonly known as:  TOPROL-XL  Take 25 mg by mouth every 12 (twelve) hours. Take with or immediately following a meal.     montelukast 10 MG tablet  Commonly known as:  SINGULAIR  Take 10 mg by mouth at bedtime.         Signed: Jarquez Mestre H. 07/12/2013, 8:04 AM

## 2013-07-12 NOTE — Care Management Note (Signed)
    Page 1 of 1   07/12/2013     1:11:56 PM   CARE MANAGEMENT NOTE 07/12/2013  Patient:  Casey, King   Account Number:  [id number]  Date Initiated:  07/12/2013  Documentation initiated by:  Casey King  Subjective/Objective Assessment:   vaginal bleeding     Action/Plan:   blood transfusion   Anticipated DC Date:  07/12/2013   Anticipated DC Plan:  HOME/SELF CARE  In-house referral  Clinical Social Worker      DC Planning Services  Medication Assistance  CM consult        Status of service:  completed  Discharge Disposition:  HOME/SELF CARE  Per UR Regulation:  Reviewed for med. necessity/level of care/duration of stay   Comments:  07/12/13 1100 Casey King Casey King BSN # [phone]- received call from the Casey Casey King on unit requesting CM see patient regarding home medication for discharge plans. Patient is self pay. Patient states can afford medication at [facility] if not too expensive.  Dr. Gala King plans to send patient home on Megace 20 mg ( which is on the 4$ List ) at [facility].  Casey King called Casey King to verify that medication has been called in and the cost is 8$ - because of quanity called in.  CM went to patient's room and patient verified that she could afford $8 and has no problem paying for her prescription today at discharge.  No other needs identified at this time.

## 2013-07-12 NOTE — Progress Notes (Signed)
Ur chart review completed.  

## 2013-07-12 NOTE — Discharge Instructions (Signed)

## 2013-07-12 NOTE — Progress Notes (Signed)
CSW provided pt with the contact number [phone] that can assist her with completing an application or insurance.  Pt told CSW that her sister know of a place in the area that will assist individuals in the community with applications, as well.  Pt thanked CSW for information & plans to follow up upon discharged.

## 2013-07-13 LAB — TYPE AND SCREEN
ABO/RH(D): O POS
Antibody Screen: NEGATIVE
Unit division: 0
Unit division: 0

## 2013-08-03 ENCOUNTER — Other Ambulatory Visit (HOSPITAL_COMMUNITY)
Admission: RE | Admit: 2013-08-03 | Discharge: 2013-08-03 | Disposition: A | Discharge status: Home / Self Care | Payer: Self-pay | Source: Ambulatory Visit | Attending: Nurse Practitioner | Admitting: Nurse Practitioner

## 2013-08-03 ENCOUNTER — Ambulatory Visit (INDEPENDENT_AMBULATORY_CARE_PROVIDER_SITE_OTHER): Payer: Self-pay | Admitting: Nurse Practitioner

## 2013-08-03 ENCOUNTER — Encounter: Payer: Self-pay | Admitting: Nurse Practitioner

## 2013-08-03 VITALS — BP 141/90 | HR 64 | Temp 98.1°F | Ht 65.0 in | Wt 306.3 lb

## 2013-08-03 DIAGNOSIS — N949 Unspecified condition associated with female genital organs and menstrual cycle: Secondary | ICD-10-CM

## 2013-08-03 DIAGNOSIS — N925 Other specified irregular menstruation: Secondary | ICD-10-CM

## 2013-08-03 DIAGNOSIS — N920 Excessive and frequent menstruation with regular cycle: Secondary | ICD-10-CM

## 2013-08-03 DIAGNOSIS — N938 Other specified abnormal uterine and vaginal bleeding: Secondary | ICD-10-CM | POA: Insufficient documentation

## 2013-08-03 DIAGNOSIS — N840 Polyp of corpus uteri: Secondary | ICD-10-CM | POA: Insufficient documentation

## 2013-08-03 DIAGNOSIS — Z01419 Encounter for gynecological examination (general) (routine) without abnormal findings: Secondary | ICD-10-CM

## 2013-08-03 DIAGNOSIS — Z0000 Encounter for general adult medical examination without abnormal findings: Secondary | ICD-10-CM

## 2013-08-03 LAB — POCT PREGNANCY, URINE: PREG TEST UR: NEGATIVE

## 2013-08-03 MED ORDER — NORGESTIMATE-ETH ESTRADIOL 0.25-35 MG-MCG PO TABS: 1.0000 | ORAL_TABLET | Freq: Every day | ORAL | Status: DC

## 2013-08-03 NOTE — Patient Instructions (Signed)
Polycystic Ovarian Syndrome Polycystic ovarian syndrome (PCOS) is a common hormonal disorder among women of reproductive age. Most women with PCOS grow many small cysts on their ovaries. PCOS can cause problems with your periods and make it difficult to get pregnant. It can also cause an increased risk of miscarriage with pregnancy. If left untreated, PCOS can lead to serious health problems, such as diabetes and heart disease. CAUSES The cause of PCOS is not fully understood, but genetics may be a factor. SIGNS AND SYMPTOMS   Infrequent or no menstrual periods.   Inability to get pregnant (infertility) because of not ovulating.   Increased growth of hair on the face, chest, stomach, back, thumbs, thighs, or toes.   Acne, oily skin, or dandruff.   Pelvic pain.   Weight gain or obesity, usually carrying extra weight around the waist.   Type 2 diabetes.   High cholesterol.   High blood pressure.   Female-pattern baldness or thinning hair.   Patches of thickened and dark brown or black skin on the neck, arms, breasts, or thighs.   Tiny excess flaps of skin (skin tags) in the armpits or neck area.   Excessive snoring and having breathing stop at times while asleep (sleep apnea).   Deepening of the voice.   Gestational diabetes when pregnant.  DIAGNOSIS  There is no single test to diagnose PCOS.   Your health care provider will:   Take a medical history.   Perform a pelvic exam.   Have ultrasonography done.   Check your female and female hormone levels.   Measure glucose or sugar levels in the blood.   Do other blood tests.   If you are producing too many female hormones, your health care provider will make sure it is from PCOS. At the physical exam, your health care provider will want to evaluate the areas of increased hair growth. Try to allow natural hair growth for a few days before the visit.   During a pelvic exam, the ovaries may be enlarged  or swollen because of the increased number of small cysts. This can be seen more easily by using vaginal ultrasonography or screening to examine the ovaries and lining of the uterus (endometrium) for cysts. The uterine lining may become thicker if you have not been having a regular period.  TREATMENT  Because there is no cure for PCOS, it needs to be managed to prevent problems. Treatments are based on your symptoms. Treatment is also based on whether you want to have a baby or whether you need contraception.  Treatment may include:   Progesterone hormone to start a menstrual period.   Birth control pills to make you have regular menstrual periods.   Medicines to make you ovulate, if you want to get pregnant.   Medicines to control your insulin.   Medicine to control your blood pressure.   Medicine and diet to control your high cholesterol and triglycerides in your blood.  Medicine to reduce excessive hair growth.  Surgery, making small holes in the ovary, to decrease the amount of female hormone production. This is done through a long, lighted tube (laparoscope) placed into the pelvis through a tiny incision in the lower abdomen.  HOME CARE INSTRUCTIONS  Only take over-the-counter or prescription medicine as directed by your health care provider.  Pay attention to the foods you eat and your activity levels. This can help reduce the effects of PCOS.  Keep your weight under control.  Eat foods that are   low in carbohydrate and high in fiber.  Exercise regularly. SEEK MEDICAL CARE IF:  Your symptoms do not get better with medicine.  You have new symptoms. Document Released: 10/09/2004 Document Revised: 04/05/2013 Document Reviewed: 12/01/2012 [facility] Patient Information 2014 [place], [place]. Endometrial Biopsy Endometrial biopsy is a procedure in which a tissue sample is taken from inside the uterus. The tissue sample is then looked at under a microscope to see if the  tissue is normal or abnormal. The endometrium is the lining of the uterus. This procedure helps determine where you are in your menstrual cycle and how hormone levels are affecting the lining of the uterus. This procedure may also be used to evaluate uterine bleeding or to diagnose endometrial cancer, tuberculosis, polyps, or inflammatory conditions.  LET [facility] CARE PROVIDER KNOW ABOUT:  Any allergies you have.  All medicines you are taking, including vitamins, herbs, eye drops, creams, and over-the-counter medicines.  Previous problems you or members of your family have had with the use of anesthetics.  Any blood disorders you have.  Previous surgeries you have had.  Medical conditions you have.  Possibility of pregnancy. RISKS AND COMPLICATIONS Generally, this is a safe procedure. However, as with any procedure, complications can occur. Possible complications include:  Bleeding.  Pelvic infection.  Puncture of the uterine wall with the biopsy device (rare). BEFORE THE PROCEDURE   Keep a record of your menstrual cycles as directed by your health care provider. You may need to schedule your procedure for a specific time in your cycle.  You may want to bring a sanitary pad to wear home after the procedure.  Arrange for someone to drive you home after the procedure if you will be given a medicine to help you relax (sedative). PROCEDURE   You may be given a sedative to relax you.  You will lie on an exam table with your feet and legs supported as in a pelvic exam.  Your health care provider will insert an instrument (speculum) into your vagina to see your cervix.  Your cervix will be cleansed with an antiseptic solution. A medicine (local anesthetic) will be used to numb the cervix.  A forceps instrument (tenaculum) will be used to hold your cervix steady for the biopsy.  A thin, rodlike instrument (uterine sound) will be inserted through your cervix to determine the  length of your uterus and the location where the biopsy sample will be removed.  A thin, flexible tube (catheter) will be inserted through your cervix and into the uterus. The catheter is used to collect the biopsy sample from your endometrial tissue.  The catheter and speculum will then be removed, and the tissue sample will be sent to a lab for examination. AFTER THE PROCEDURE  You will rest in a recovery area until you are ready to go home.  You may have mild cramping and a small amount of vaginal bleeding for a few days after the procedure. This is normal.  Make sure you find out how to get your test results. Document Released: 10/16/2004 Document Revised: 02/15/2013 Document Reviewed: 11/30/2012 [facility] Patient Information 2014 [place], [place].

## 2013-08-03 NOTE — Progress Notes (Signed)
History:  Casey King a 33 y.o. G0P0 who presents to [facility] clinic today for DUB. She is scheduled today for an Endometrial Biopsy.  She has heavy menses and bled down to 9.9 and 30.2 and was transfused 2 units after passing out in Jan 2014. She has always had irregular and heavy bleeding. She always assumed it was because of her weight. She has recently lost 72 lbs and is interested in becoming pregnant.  She had the same problem 3 years ago and required a D&C. She was seen by Dr Gala Romney in MAU who suggested BCP's. She is willing to start these today. She does have HTN but it is controlled. She can not recall her last pap smear  The following portions of the patient's history were reviewed and updated as appropriate: allergies, current medications, past family history, past medical history, past social history, past surgical history and problem list.  Review of Systems:  Pertinent items are noted in HPI.  Objective:  Physical Exam BP 141/90  Pulse 64  Temp(Src) 98.1 F (36.7 C)  Ht 5\' 5"  (1.651 m)  Wt 306 lb 4.8 oz (138.937 kg)  BMI 50.97 kg/m2 GENERAL: Well-developed, well-nourished female in no acute distress. Morbid Obesity HEENT: Normocephalic, atraumatic.  NECK: Supple. Normal thyroid.  LUNGS: Normal rate. Clear to auscultation bilaterally.  HEART: Regular rate and rhythm with no adventitious sounds.  BREASTS: Symmetric in size. No masses, skin changes, nipple drainage, or lymphadenopathy. ABDOMEN: Soft, nontender, nondistended. No organomegaly. Normal bowel sounds appreciated in all quadrants.  PELVIC: Normal external female genitalia. Vagina is pink and rugated.  Normal discharge. Normal cervix contour. Pap smear obtained. Not able to assess uterus/ adnexa due to size EXTREMITIES: No cyanosis, clubbing, or edema, 2+ distal pulses.  Endometrial Biopsy was obtained without any difficulties. Samples sent. Consent signed  Labs and Imaging US Transvaginal Non-ob  07/11/2013    CLINICAL DATA:  Heavy vaginal bleeding.  EXAM: TRANSABDOMINAL AND TRANSVAGINAL ULTRASOUND OF PELVIS  TECHNIQUE: Both transabdominal and transvaginal ultrasound examinations of the pelvis were performed. Transabdominal technique was performed for global imaging of the pelvis including uterus, ovaries, adnexal regions, and pelvic cul-de-sac. It was necessary to proceed with endovaginal exam following the transabdominal exam to visualize the endometrium and adnexa.  COMPARISON:  Pelvic ultrasound 08/26/2010.  FINDINGS: Uterus  Measurements: 9.5 x 5.4 x 6.7 cm. No fibroids or other mass visualized.  Endometrium  Thickness: Heterogeneous and abnormally thickened at 2.3 cm. There is flow within the contents of the endometrial canal on Doppler imaging. No focal abnormality visualized.  Right ovary  Measurements: Not visualized.  Left ovary  Measurements: Not visualized.  Other findings  A small to moderate volume of simple appearing free pelvic fluid is identified.  IMPRESSION: Abnormally thickened endometrium with blood flow within the endometrium on Doppler imaging. Consider endometrial biopsy.  The ovaries are not visualized.  Small to moderate volume of simple appearing free pelvic fluid.   Electronically Signed   By: Inge Rise M.D.   On: 07/11/2013 18:57      Assessment & Plan:  Assessment:  DUB Morbid Obesity Infertility  Plans:  Endometrial Biopsy Pap smear Start today OrthoCyclen daily. Call if any problem arise with pills Follow up 2 weeks for results and BP check  Olegario Messier, NP 08/03/2013 4:51 PM

## 2013-08-07 ENCOUNTER — Encounter: Payer: Self-pay | Admitting: *Deleted

## 2013-08-14 NOTE — Progress Notes (Signed)
Pt called and stated that she is having severe cramping and she has only taken five days of BCP that was prescribed to her.  Looking at patients chart patient came in on 08/03/13 and was prescribed BCP's (Sprintec) to regulate period due to having irregular periods and that she has a follow up on 09/15/13 with Monna Fam on how BCP's is working.  I advised that it does at least take two to three packs before it has the result controlling her bleeding and that it should help with pain.  I also advised her that she can take ibuprofen 800 mg po q 8 hours for pain and to please continue to take the BCP as prescribed and she will be reevaluated at her visit on 09/15/13.  I informed pt that her pap was normal.  Pt stated understanding with no further questions.

## 2013-08-19 ENCOUNTER — Inpatient Hospital Stay (HOSPITAL_COMMUNITY)
Admission: AD | Admit: 2013-08-19 | Discharge: 2013-08-19 | Disposition: A | Discharge status: Home / Self Care | Payer: Self-pay | Source: Ambulatory Visit | Attending: Obstetrics & Gynecology | Admitting: Obstetrics & Gynecology

## 2013-08-19 ENCOUNTER — Encounter (HOSPITAL_COMMUNITY): Payer: Self-pay

## 2013-08-19 DIAGNOSIS — N949 Unspecified condition associated with female genital organs and menstrual cycle: Secondary | ICD-10-CM | POA: Insufficient documentation

## 2013-08-19 DIAGNOSIS — I10 Essential (primary) hypertension: Secondary | ICD-10-CM | POA: Insufficient documentation

## 2013-08-19 DIAGNOSIS — N938 Other specified abnormal uterine and vaginal bleeding: Secondary | ICD-10-CM | POA: Insufficient documentation

## 2013-08-19 DIAGNOSIS — D649 Anemia, unspecified: Secondary | ICD-10-CM | POA: Insufficient documentation

## 2013-08-19 DIAGNOSIS — R109 Unspecified abdominal pain: Secondary | ICD-10-CM | POA: Insufficient documentation

## 2013-08-19 DIAGNOSIS — Z87891 Personal history of nicotine dependence: Secondary | ICD-10-CM | POA: Insufficient documentation

## 2013-08-19 LAB — CBC
HCT: 28.8 % — ABNORMAL LOW (ref 36.0–46.0)
Hemoglobin: 9 g/dL — ABNORMAL LOW (ref 12.0–15.0)
MCH: 24.4 pg — AB (ref 26.0–34.0)
MCHC: 31.3 g/dL (ref 30.0–36.0)
MCV: 78 fL (ref 78.0–100.0)
PLATELETS: 308 10*3/uL (ref 150–400)
RBC: 3.69 MIL/uL — AB (ref 3.87–5.11)
RDW: 14.1 % (ref 11.5–15.5)
WBC: 9.3 10*3/uL (ref 4.0–10.5)

## 2013-08-19 MED ORDER — MEGESTROL ACETATE 40 MG PO TABS
40.0000 mg | ORAL_TABLET | Freq: Every day | ORAL | Status: DC
  Administered 2013-08-19: 40 mg via ORAL
  Filled 2013-08-19 (×2): qty 1

## 2013-08-19 MED ORDER — MEGESTROL ACETATE 40 MG PO TABS: 40.0000 mg | ORAL_TABLET | Freq: Two times a day (BID) | ORAL | Status: DC

## 2013-08-19 MED ORDER — KETOROLAC TROMETHAMINE 60 MG/2ML IM SOLN
60.0000 mg | Freq: Once | INTRAMUSCULAR | Status: AC
  Administered 2013-08-19: 60 mg via INTRAMUSCULAR
  Filled 2013-08-19: qty 2

## 2013-08-19 NOTE — MAU Provider Note (Signed)
History     CSN: [id number]  Arrival date and time: 08/19/13 1726   First Provider Initiated Contact with Patient 08/19/13 1900      Chief Complaint  Patient presents with  . Vaginal Bleeding   HPI  [name] is a 33 y.o. female G0P0 who presents with heavy vaginal bleeding; pt has a history of DUB.  Pt was seen in the clinic on February 5th and was prescribed birth control pills following an endometrial biopsy. Pt started the birth control pills that Sunday and took them for only 9 days and then stopped them because she started bleeding and cramping. She has not stopped bleeding and cramping since she stopped the pills. She is changing her pad/tampon every 30 mins to 1 hour.  Pt is having abdominal cramping; has not taken anything for pain today.   OB History   Grav Para Term Preterm Abortions TAB SAB Ect Mult Living   0               Past Medical History  Diagnosis Date  . Morbid obesity   . Hypertension   . Anemia   . Palpitations   . History of hypothyroidism   . Chest pain   . SOB (shortness of breath)   . Fatigue     Past Surgical History  Procedure Laterality Date  . Endometrial biopsy    . Dilation and curettage of uterus      Family History  Problem Relation Age of Onset  . Cancer Mother     Breast Cancer  . Hypertension Father   . Diabetes Father   . Cancer Sister   . Cancer Maternal Grandmother     History  Substance Use Topics  . Smoking status: Former Research scientist (life sciences)  . Smokeless tobacco: Never Used  . Alcohol Use: No    Allergies: No Known Allergies  Prescriptions prior to admission  Medication Sig Dispense Refill  . ferrous sulfate 325 (65 FE) MG tablet Take 325 mg by mouth daily with breakfast.      . KRILL OIL PO Take 1 capsule by mouth daily.      . megestrol (MEGACE) 20 MG tablet Take 2 tablet by mouth 3 times a day for 3 days, then 2 tablets a day for 2 days, then 2 tablets once a day until you see the MD.  60 tablet  0  .  metoprolol succinate (TOPROL-XL) 50 MG 24 hr tablet Take 25 mg by mouth every 12 (twelve) hours. Take with or immediately following a meal.      . montelukast (SINGULAIR) 10 MG tablet Take 10 mg by mouth at bedtime.      . norgestimate-ethinyl estradiol (ORTHO-CYCLEN,SPRINTEC,PREVIFEM) 0.25-35 MG-MCG tablet Take 1 tablet by mouth daily.  1 Package  3   Results for orders placed during the hospital encounter of 08/19/13 (from the past 48 hour(s))  CBC     Status: Abnormal   Collection Time    08/19/13  8:30 AM      Result Value Ref Range   WBC 9.3  4.0 - 10.5 K/uL   RBC 3.69 (*) 3.87 - 5.11 MIL/uL   Hemoglobin 9.0 (*) 12.0 - 15.0 g/dL   HCT 28.8 (*) 36.0 - 46.0 %   MCV 78.0  78.0 - 100.0 fL   MCH 24.4 (*) 26.0 - 34.0 pg   MCHC 31.3  30.0 - 36.0 g/dL   RDW 14.1  11.5 - 15.5 %   Platelets  308  150 - 400 K/uL     Review of Systems  Gastrointestinal: Positive for abdominal pain (+ cramping ).  Genitourinary: Negative for dysuria, urgency, frequency, hematuria and flank pain.       No vaginal discharge. + vaginal bleeding; heavy, dark red blood with some clots  No dysuria.    Physical Exam   Blood pressure 141/86, pulse 84, temperature 98.2 F (36.8 C), temperature source Oral, resp. rate 18, height 5\' 5"  (1.651 m), weight 140.218 kg (309 lb 2 oz), SpO2 100.00%.  Physical Exam  Constitutional: She is oriented to person, place, and time. She appears well-developed and well-nourished. No distress.  HENT:  Head: Normocephalic.  Eyes: Pupils are equal, round, and reactive to light.  Neck: Neck supple.  Respiratory: Effort normal.  GI: Soft. She exhibits no distension and no mass. There is no tenderness. There is no rebound and no guarding.  Genitourinary:  Speculum exam: Vagina - Moderate amount of dark red blood in vaginal canal; 1 small blood clot noted  Cervix - Moderate amount of active bleeding  Bimanual exam: Cervix closed Uterus non tender, normal size Adnexa non tender,  no masses bilaterally Chaperone present for exam.   Musculoskeletal: Normal range of motion.  Neurological: She is alert and oriented to person, place, and time.  Skin: Skin is warm. She is not diaphoretic.  Psychiatric: Her behavior is normal.    MAU Course  Procedures  MDM CBC  Toradol 60 mg IM; pain improved following injection Megace 40 mg tab given in MAU  Consulted with Dr. Ihor Dow  Assessment and Plan   A:  1. DUB (dysfunctional uterine bleeding)   2.  Anemia   P:  Discharge home in stable condition Continue taking iron supplements  RX: Megace  Bleeding precautions discussed Keep your clinic appointment in March Return to MAU as needed if symptoms worsen, warning signs discussed   Darrelyn Hillock Rasch, NP 08/19/2013, 7:55 PM

## 2013-08-19 NOTE — MAU Provider Note (Signed)
Attestation of Attending Supervision of Advanced Practitioner (CNM/NP): Evaluation and management procedures were performed by the Advanced Practitioner under my supervision and collaboration.  I have reviewed the Advanced Practitioner's note and chart, and I agree with the management and plan.  HARRAWAY-SMITH, Mackinley Cassaday 7:57 PM

## 2013-08-19 NOTE — Discharge Instructions (Signed)

## 2013-08-19 NOTE — MAU Note (Signed)
Pt states has hx of DUB, was seen in clinic and was started on sprintec. Took for a few days and bleeding became worse. Stopped taking bc pills. Has been passing large clots. Has hx of anemia as well.

## 2013-08-29 ENCOUNTER — Ambulatory Visit (INDEPENDENT_AMBULATORY_CARE_PROVIDER_SITE_OTHER): Payer: BC Managed Care – PPO | Admitting: Adult Health

## 2013-08-29 ENCOUNTER — Encounter: Payer: Self-pay | Admitting: Adult Health

## 2013-08-29 VITALS — BP 112/74 | Ht 65.0 in | Wt 311.0 lb

## 2013-08-29 DIAGNOSIS — N920 Excessive and frequent menstruation with regular cycle: Secondary | ICD-10-CM

## 2013-08-29 DIAGNOSIS — N926 Irregular menstruation, unspecified: Secondary | ICD-10-CM

## 2013-08-29 DIAGNOSIS — Z862 Personal history of diseases of the blood and blood-forming organs and certain disorders involving the immune mechanism: Secondary | ICD-10-CM

## 2013-08-29 DIAGNOSIS — Z3200 Encounter for pregnancy test, result unknown: Secondary | ICD-10-CM

## 2013-08-29 DIAGNOSIS — Z3202 Encounter for pregnancy test, result negative: Secondary | ICD-10-CM

## 2013-08-29 HISTORY — DX: Personal history of diseases of the blood and blood-forming organs and certain disorders involving the immune mechanism: Z86.2

## 2013-08-29 LAB — POCT URINE PREGNANCY: PREG TEST UR: NEGATIVE

## 2013-08-29 MED ORDER — MEGESTROL ACETATE 40 MG PO TABS: ORAL_TABLET | ORAL | Status: DC

## 2013-08-29 NOTE — Progress Notes (Signed)
Subjective:     Patient ID: Casey King, female   DOB: 1981-01-02, 33 y.o.   MRN: [id number]  HPI Treina is a 33 year old white female, married in for heavy bleeding, was seen at [facility] clinic and had pap, [place] and endo biopsy, which showed benign endometrial polyp, pap was normal with negative HPV. Her HGB was 8.9 and she got 2 units PRBC, last HGB 9 on 08/19/13.On megace now and bleeding almost stopped.Has history of abnormal bleeding in past and had D&C.She does not have children and would like one.She has lost 65 lbs in last 3 years or so.  Review of Systems See HPI Reviewed past medical,surgical, social and family history. Reviewed medications and allergies.     Objective:   Physical Exam BP 112/74  Ht 5\' 5"  (1.651 m)  Wt 311 lb (141.069 kg)  BMI 51.75 kg/m2 Skin warm and dry.Pelvic: external genitalia is normal in appearance, vagina: scant discharge without odor, cervix:smooth, uterus: normal size, shape and contour, non tender, no masses felt, adnexa: no masses or tenderness noted.Reviewed prior labs,pap,endo biopsy results and [place] with pt.    Assessment:     Menorrhagia History of anemia     Plan:     Rx megace 3 x 5 days then 2 daily #60 with 1 refill Take iron tabs Check CBC,CMP and anemia profile Follow up in 4 weeks Review handout on menorrhagia

## 2013-08-29 NOTE — Patient Instructions (Signed)
Menorrhagia Menorrhagia is the medical term for when your menstrual periods are heavy or last longer than usual. With menorrhagia, every period you have may cause enough blood loss and cramping that you are unable to maintain your usual activities. CAUSES  In some cases, the cause of heavy periods is unknown, but a number of conditions may cause menorrhagia. Common causes include:  A problem with the hormone-producing thyroid gland (hypothyroid).  Noncancerous growths in the uterus (polyps or fibroids).  An imbalance of the estrogen and progesterone hormones.  One of your ovaries not releasing an egg during one or more months.  Side effects of having an intrauterine device (IUD).  Side effects of some medicines, such as anti-inflammatory medicines or blood thinners.  A bleeding disorder that stops your blood from clotting normally. SIGNS AND SYMPTOMS  During a normal period, bleeding lasts between 4 and 8 days. Signs that your periods are too heavy include:  You routinely have to change your pad or tampon every 1 or 2 hours because it is completely soaked.  You pass blood clots larger than 1 inch (2.5 cm) in size.  You have bleeding for more than 7 days.  You need to use pads and tampons at the same time because of heavy bleeding.  You need to wake up to change your pads or tampons during the night.  You have symptoms of anemia, such as tiredness, fatigue, or shortness of breath. DIAGNOSIS  Your health care provider will perform a physical exam and ask you questions about your symptoms and menstrual history. Other tests may be ordered based on what the health care provider finds during the exam. These tests can include:  Blood tests To check if you are pregnant or have hormonal changes, a bleeding or thyroid disorder, low iron levels (anemia), or other problems.  Endometrial biopsy Your health care provider takes a sample of tissue from the inside of your uterus to be examined  under a microscope.  Pelvic ultrasound This test uses sound waves to make a picture of your uterus, ovaries, and vagina. The pictures can show if you have fibroids or other growths.  Hysteroscopy For this test, your health care provider will use a small telescope to look inside your uterus. Based on the results of your initial tests, your health care provider may recommend further testing. TREATMENT  Treatment may not be needed. If it is needed, your health care provider may recommend treatment with one or more medicines first. If these do not reduce bleeding enough, a surgical treatment might be an option. The best treatment for you will depend on:   Whether you need to prevent pregnancy.  Your desire to have children in the future.  The cause and severity of your bleeding.  Your opinion and personal preference.  Medicines for menorrhagia may include:  Birth control methods that use hormones These include the pill, skin patch, vaginal ring, shots that you get every 3 months, hormonal IUD, and implant. These treatments reduce bleeding during your menstrual period.  Medicines that thicken blood and slow bleeding.  Medicines that reduce swelling, such as ibuprofen.  Medicines that contain a synthetic hormone called progestin.   Medicines that make the ovaries stop working for a short time.  You may need surgical treatment for menorrhagia if the medicines are unsuccessful. Treatment options include:  Dilation and curettage (D&C) In this procedure, your health care provider opens (dilates) your cervix and then scrapes or suctions tissue from the lining of your  uterus to reduce menstrual bleeding.  Operative hysteroscopy This procedure uses a tiny tube with a light (hysteroscope) to view your uterine cavity and can help in the surgical removal of a polyp that may be causing heavy periods.  Endometrial ablation Through various techniques, your health care provider permanently  destroys the entire lining of your uterus (endometrium). After endometrial ablation, most women have little or no menstrual flow. Endometrial ablation reduces your ability to become pregnant.  Endometrial resection This surgical procedure uses an electrosurgical wire loop to remove the lining of the uterus. This procedure also reduces your ability to become pregnant.  Hysterectomy Surgical removal of the uterus and cervix is a permanent procedure that stops menstrual periods. Pregnancy is not possible after a hysterectomy. This procedure requires anesthesia and hospitalization. HOME CARE INSTRUCTIONS   Only take over-the-counter or prescription medicines as directed by your health care provider. Take prescribed medicines exactly as directed. Do not change or switch medicines without consulting your health care provider.  Take any prescribed iron pills exactly as directed by your health care provider. Long-term heavy bleeding may result in low iron levels. Iron pills help replace the iron your body lost from heavy bleeding. Iron may cause constipation. If this becomes a problem, increase the bran, fruits, and roughage in your diet.  Do not take aspirin or medicines that contain aspirin 1 week before or during your menstrual period. Aspirin may make the bleeding worse.  If you need to change your sanitary pad or tampon more than once every 2 hours, stay in bed and rest as much as possible until the bleeding stops.  Eat well-balanced meals. Eat foods high in iron. Examples are leafy green vegetables, meat, liver, eggs, and whole grain breads and cereals. Do not try to lose weight until the abnormal bleeding has stopped and your blood iron level is back to normal. SEEK MEDICAL CARE IF:   You soak through a pad or tampon every 1 or 2 hours, and this happens every time you have a period.  You need to use pads and tampons at the same time because you are bleeding so much.  You need to change your pad  or tampon during the night.  You have a period that lasts for more than 8 days.  You pass clots bigger than 1 inch wide.  You have irregular periods that happen more or less often than once a month.  You feel dizzy or faint.  You feel very weak or tired.  You feel short of breath or feel your heart is beating too fast when you exercise.  You have nausea and vomiting or diarrhea while you are taking your medicine.  You have any problems that may be related to the medicine you are taking. SEEK IMMEDIATE MEDICAL CARE IF:   You soak through 4 or more pads or tampons in 2 hours.  You have any bleeding while you are pregnant. MAKE SURE YOU:   Understand these instructions.  Will watch your condition.  Will get help right away if you are not doing well or get worse. Document Released: 06/15/2005 Document Revised: 04/05/2013 Document Reviewed: 12/04/2012 [facility] Patient Information 2014 [address]. Take megace 3 x 5 days then 2 daily  Return in 4 weeks

## 2013-08-29 NOTE — Addendum Note (Signed)
Addended by: Derrek Monaco A on: 08/29/2013 02:03 PM   Modules accepted: Orders

## 2013-08-30 LAB — IRON AND TIBC
%SAT: 27 % (ref 20–55)
Iron: 107 ug/dL (ref 42–145)
TIBC: 390 ug/dL (ref 250–470)
UIBC: 283 ug/dL (ref 125–400)

## 2013-08-30 LAB — COMPREHENSIVE METABOLIC PANEL
ALBUMIN: 4 g/dL (ref 3.5–5.2)
ALK PHOS: 24 U/L — AB (ref 39–117)
ALT: 10 U/L (ref 0–35)
AST: 9 U/L (ref 0–37)
BUN: 11 mg/dL (ref 6–23)
CO2: 24 mEq/L (ref 19–32)
Calcium: 8.8 mg/dL (ref 8.4–10.5)
Chloride: 108 mEq/L (ref 96–112)
Creat: 0.64 mg/dL (ref 0.50–1.10)
GLUCOSE: 88 mg/dL (ref 70–99)
POTASSIUM: 4.1 meq/L (ref 3.5–5.3)
SODIUM: 139 meq/L (ref 135–145)
TOTAL PROTEIN: 6.3 g/dL (ref 6.0–8.3)
Total Bilirubin: 0.3 mg/dL (ref 0.2–1.2)

## 2013-08-30 LAB — CBC
HCT: 29.2 % — ABNORMAL LOW (ref 36.0–46.0)
HEMOGLOBIN: 8.9 g/dL — AB (ref 12.0–15.0)
MCH: 22.9 pg — ABNORMAL LOW (ref 26.0–34.0)
MCHC: 30.5 g/dL (ref 30.0–36.0)
MCV: 75.3 fL — ABNORMAL LOW (ref 78.0–100.0)
Platelets: 381 10*3/uL (ref 150–400)
RBC: 3.88 MIL/uL (ref 3.87–5.11)
RDW: 14.9 % (ref 11.5–15.5)
WBC: 7.3 10*3/uL (ref 4.0–10.5)

## 2013-08-30 LAB — FERRITIN: Ferritin: 5 ng/mL — ABNORMAL LOW (ref 10–291)

## 2013-08-30 LAB — VITAMIN B12: Vitamin B-12: 376 pg/mL (ref 211–911)

## 2013-08-30 LAB — FOLATE: Folate: 18.9 ng/mL

## 2013-09-01 ENCOUNTER — Telehealth: Payer: Self-pay | Admitting: Adult Health

## 2013-09-01 NOTE — Telephone Encounter (Signed)
Pt aware of labs and bleeding has slowed down a lot, keep appt

## 2013-09-15 ENCOUNTER — Ambulatory Visit: Payer: Self-pay | Admitting: Nurse Practitioner

## 2013-10-02 ENCOUNTER — Ambulatory Visit (INDEPENDENT_AMBULATORY_CARE_PROVIDER_SITE_OTHER): Payer: BC Managed Care – PPO | Admitting: Adult Health

## 2013-10-02 ENCOUNTER — Encounter: Payer: Self-pay | Admitting: Adult Health

## 2013-10-02 VITALS — BP 100/60 | Ht 65.0 in | Wt 314.0 lb

## 2013-10-02 DIAGNOSIS — N920 Excessive and frequent menstruation with regular cycle: Secondary | ICD-10-CM

## 2013-10-02 DIAGNOSIS — N926 Irregular menstruation, unspecified: Secondary | ICD-10-CM

## 2013-10-02 DIAGNOSIS — Z862 Personal history of diseases of the blood and blood-forming organs and certain disorders involving the immune mechanism: Secondary | ICD-10-CM

## 2013-10-02 DIAGNOSIS — N946 Dysmenorrhea, unspecified: Secondary | ICD-10-CM | POA: Insufficient documentation

## 2013-10-02 HISTORY — DX: Dysmenorrhea, unspecified: N94.6

## 2013-10-02 LAB — CBC
HCT: 29.7 % — ABNORMAL LOW (ref 36.0–46.0)
HEMOGLOBIN: 9.1 g/dL — AB (ref 12.0–15.0)
MCH: 23.1 pg — ABNORMAL LOW (ref 26.0–34.0)
MCHC: 30.6 g/dL (ref 30.0–36.0)
MCV: 75.4 fL — ABNORMAL LOW (ref 78.0–100.0)
Platelets: 279 10*3/uL (ref 150–400)
RBC: 3.94 MIL/uL (ref 3.87–5.11)
RDW: 15.8 % — ABNORMAL HIGH (ref 11.5–15.5)
WBC: 7 10*3/uL (ref 4.0–10.5)

## 2013-10-02 NOTE — Patient Instructions (Signed)
Dysmenorrhea Menstrual cramps (dysmenorrhea) are caused by the muscles of the uterus tightening (contracting) during a menstrual period. For some women, this discomfort is merely bothersome. For others, dysmenorrhea can be severe enough to interfere with everyday activities for a few days each month. Primary dysmenorrhea is menstrual cramps that last a couple of days when you start having menstrual periods or soon after. This often begins after a teenager starts having her period. As a woman gets older or has a baby, the cramps will usually lessen or disappear. Secondary dysmenorrhea begins later in life, lasts longer, and the pain may be stronger than primary dysmenorrhea. The pain may start before the period and last a few days after the period.  CAUSES  Dysmenorrhea is usually caused by an underlying problem, such as:  The tissue lining the uterus grows outside of the uterus in other areas of the body (endometriosis).  The endometrial tissue, which normally lines the uterus, is found in or grows into the muscular walls of the uterus (adenomyosis).  The pelvic blood vessels are engorged with blood just before the menstrual period (pelvic congestive syndrome).  Overgrowth of cells (polyps) in the lining of the uterus or cervix.  Falling down of the uterus (prolapse) because of loose or stretched ligaments.  Depression.  Bladder problems, infection, or inflammation.  Problems with the intestine, a tumor, or irritable bowel syndrome.  Cancer of the female organs or bladder.  A severely tipped uterus.  A very tight opening or closed cervix.  Noncancerous tumors of the uterus (fibroids).  Pelvic inflammatory disease (PID).  Pelvic scarring (adhesions) from a previous surgery.  Ovarian cyst.  An intrauterine device (IUD) used for birth control. RISK FACTORS You may be at greater risk of dysmenorrhea if:  You are younger than age 62.  You started puberty early.  You have  irregular or heavy bleeding.  You have never given birth.  You have a family history of this problem.  You are a smoker. SIGNS AND SYMPTOMS   Cramping or throbbing pain in your lower abdomen.  Headaches.  Lower back pain.  Nausea or vomiting.  Diarrhea.  Sweating or dizziness.  Loose stools. DIAGNOSIS  A diagnosis is based on your history, symptoms, physical exam, diagnostic tests, or procedures. Diagnostic tests or procedures may include:  Blood tests.  Ultrasonography.  An examination of the lining of the uterus (dilation and curettage, D&C).  An examination inside your abdomen or pelvis with a scope (laparoscopy).  X-rays.  CT scan.  MRI.  An examination inside the bladder with a scope (cystoscopy).  An examination inside the intestine or stomach with a scope (colonoscopy, gastroscopy). TREATMENT  Treatment depends on the cause of the dysmenorrhea. Treatment may include:  Pain medicine prescribed by your health care provider.  Birth control pills or an IUD with progesterone hormone in it.  Hormone replacement therapy.  Nonsteroidal anti-inflammatory drugs (NSAIDs). These may help stop the production of prostaglandins.  Surgery to remove adhesions, endometriosis, ovarian cyst, or fibroids.  Removal of the uterus (hysterectomy).  Progesterone shots to stop the menstrual period.  Cutting the nerves on the sacrum that go to the female organs (presacral neurectomy).  Electric current to the sacral nerves (sacral nerve stimulation).  Antidepressant medicine.  Psychiatric therapy, counseling, or group therapy.  Exercise and physical therapy.  Meditation and yoga therapy.  Acupuncture. HOME CARE INSTRUCTIONS   Only take over-the-counter or prescription medicines as directed by your health care provider.  Place a heating pad  or hot water bottle on your lower back or abdomen. Do not sleep with the heating pad.  Use aerobic exercises, walking,  swimming, biking, and other exercises to help lessen the cramping.  Massage to the lower back or abdomen may help.  Stop smoking.  Avoid alcohol and caffeine. SEEK MEDICAL CARE IF:   Your pain does not get better with medicine.  You have pain with sexual intercourse.  Your pain increases and is not controlled with medicines.  You have abnormal vaginal bleeding with your period.  You develop nausea or vomiting with your period that is not controlled with medicine. SEEK IMMEDIATE MEDICAL CARE IF:  You pass out.  Document Released: 06/15/2005 Document Revised: 02/15/2013 Document Reviewed: 12/01/2012 [facility] Patient Information 2014 [place]. Menorrhagia Menorrhagia is the medical term for when your menstrual periods are heavy or last longer than usual. With menorrhagia, every period you have may cause enough blood loss and cramping that you are unable to maintain your usual activities. CAUSES  In some cases, the cause of heavy periods is unknown, but a number of conditions may cause menorrhagia. Common causes include:  A problem with the hormone-producing thyroid gland (hypothyroid).  Noncancerous growths in the uterus (polyps or fibroids).  An imbalance of the estrogen and progesterone hormones.  One of your ovaries not releasing an egg during one or more months.  Side effects of having an intrauterine device (IUD).  Side effects of some medicines, such as anti-inflammatory medicines or blood thinners.  A bleeding disorder that stops your blood from clotting normally. SIGNS AND SYMPTOMS  During a normal period, bleeding lasts between 4 and 8 days. Signs that your periods are too heavy include:  You routinely have to change your pad or tampon every 1 or 2 hours because it is completely soaked.  You pass blood clots larger than 1 inch (2.5 cm) in size.  You have bleeding for more than 7 days.  You need to use pads and tampons at the same time because of heavy  bleeding.  You need to wake up to change your pads or tampons during the night.  You have symptoms of anemia, such as tiredness, fatigue, or shortness of breath. DIAGNOSIS  Your health care provider will perform a physical exam and ask you questions about your symptoms and menstrual history. Other tests may be ordered based on what the health care provider finds during the exam. These tests can include:  Blood tests To check if you are pregnant or have hormonal changes, a bleeding or thyroid disorder, low iron levels (anemia), or other problems.  Endometrial biopsy Your health care provider takes a sample of tissue from the inside of your uterus to be examined under a microscope.  Pelvic ultrasound This test uses sound waves to make a picture of your uterus, ovaries, and vagina. The pictures can show if you have fibroids or other growths.  Hysteroscopy For this test, your health care provider will use a small telescope to look inside your uterus. Based on the results of your initial tests, your health care provider may recommend further testing. TREATMENT  Treatment may not be needed. If it is needed, your health care provider may recommend treatment with one or more medicines first. If these do not reduce bleeding enough, a surgical treatment might be an option. The best treatment for you will depend on:   Whether you need to prevent pregnancy.  Your desire to have children in the future.  The cause and  severity of your bleeding.  Your opinion and personal preference.  Medicines for menorrhagia may include:  Birth control methods that use hormones These include the pill, skin patch, vaginal ring, shots that you get every 3 months, hormonal IUD, and implant. These treatments reduce bleeding during your menstrual period.  Medicines that thicken blood and slow bleeding.  Medicines that reduce swelling, such as ibuprofen.  Medicines that contain a synthetic hormone called  progestin.   Medicines that make the ovaries stop working for a short time.  You may need surgical treatment for menorrhagia if the medicines are unsuccessful. Treatment options include:  Dilation and curettage (D&C) In this procedure, your health care provider opens (dilates) your cervix and then scrapes or suctions tissue from the lining of your uterus to reduce menstrual bleeding.  Operative hysteroscopy This procedure uses a tiny tube with a light (hysteroscope) to view your uterine cavity and can help in the surgical removal of a polyp that may be causing heavy periods.  Endometrial ablation Through various techniques, your health care provider permanently destroys the entire lining of your uterus (endometrium). After endometrial ablation, most women have little or no menstrual flow. Endometrial ablation reduces your ability to become pregnant.  Endometrial resection This surgical procedure uses an electrosurgical wire loop to remove the lining of the uterus. This procedure also reduces your ability to become pregnant.  Hysterectomy Surgical removal of the uterus and cervix is a permanent procedure that stops menstrual periods. Pregnancy is not possible after a hysterectomy. This procedure requires anesthesia and hospitalization. HOME CARE INSTRUCTIONS   Only take over-the-counter or prescription medicines as directed by your health care provider. Take prescribed medicines exactly as directed. Do not change or switch medicines without consulting your health care provider.  Take any prescribed iron pills exactly as directed by your health care provider. Long-term heavy bleeding may result in low iron levels. Iron pills help replace the iron your body lost from heavy bleeding. Iron may cause constipation. If this becomes a problem, increase the bran, fruits, and roughage in your diet.  Do not take aspirin or medicines that contain aspirin 1 week before or during your menstrual period.  Aspirin may make the bleeding worse.  If you need to change your sanitary pad or tampon more than once every 2 hours, stay in bed and rest as much as possible until the bleeding stops.  Eat well-balanced meals. Eat foods high in iron. Examples are leafy green vegetables, meat, liver, eggs, and whole grain breads and cereals. Do not try to lose weight until the abnormal bleeding has stopped and your blood iron level is back to normal. SEEK MEDICAL CARE IF:   You soak through a pad or tampon every 1 or 2 hours, and this happens every time you have a period.  You need to use pads and tampons at the same time because you are bleeding so much.  You need to change your pad or tampon during the night.  You have a period that lasts for more than 8 days.  You pass clots bigger than 1 inch wide.  You have irregular periods that happen more or less often than once a month.  You feel dizzy or faint.  You feel very weak or tired.  You feel short of breath or feel your heart is beating too fast when you exercise.  You have nausea and vomiting or diarrhea while you are taking your medicine.  You have any problems that may be related  to the medicine you are taking. SEEK IMMEDIATE MEDICAL CARE IF:   You soak through 4 or more pads or tampons in 2 hours.  You have any bleeding while you are pregnant. MAKE SURE YOU:   Understand these instructions.  Will watch your condition.  Will get help right away if you are not doing well or get worse. Document Released: 06/15/2005 Document Revised: 04/05/2013 Document Reviewed: 12/04/2012 [facility] Patient Information 2014 [place]. Return in 4 days for [place] Continue megace and the iron

## 2013-10-02 NOTE — Progress Notes (Signed)
Subjective:     Patient ID: Sharryn Belding, female   DOB: [birth year], 33 y.o.   MRN: [id number]  HPI Dorathea is back in follow up of bleeding which slowed with megace and has stopped some days but returns at night and she has had cramps and discomfort but declines pain meds.Had biopsy in February which showed benign endometrial polyp.  Review of Systems See HPI Reviewed past medical,surgical, social and family history. Reviewed medications and allergies.     Objective:   Physical Exam BP 100/60  Ht 5\' 5"  (1.651 m)  Wt 314 lb (142.429 kg)  BMI 52.25 kg/m2  LMP 09/28/2013   Skin warm and dry.Pelvic: external genitalia is normal in appearance, vagina: scant discharge without odor, cervix:smooth, but poorly seen, uterus: normal size, shape and contour, non tender, no masses felt, adnexa: no masses or tenderness noted.  Assessment:     Menorrhagia History of anemia Dysmenorrhea Irregular periods    Plan:    Continue iron and megace Check CBC Return in 4 days for [place] to assess lining and ?polyp remnant and see me,if persists discuss with Dr Elonda Husky for possible hysteroscopy and D&C Review handouts

## 2013-10-04 ENCOUNTER — Telehealth: Payer: Self-pay | Admitting: Adult Health

## 2013-10-04 NOTE — Telephone Encounter (Signed)
Pt aware hgb 9.1 keep taking iron and keep appt Friday

## 2013-10-06 ENCOUNTER — Ambulatory Visit (INDEPENDENT_AMBULATORY_CARE_PROVIDER_SITE_OTHER): Payer: BC Managed Care – PPO

## 2013-10-06 ENCOUNTER — Ambulatory Visit (INDEPENDENT_AMBULATORY_CARE_PROVIDER_SITE_OTHER): Payer: BC Managed Care – PPO | Admitting: Adult Health

## 2013-10-06 ENCOUNTER — Encounter: Payer: Self-pay | Admitting: Adult Health

## 2013-10-06 VITALS — BP 120/70 | Ht 65.0 in | Wt 314.6 lb

## 2013-10-06 DIAGNOSIS — N920 Excessive and frequent menstruation with regular cycle: Secondary | ICD-10-CM

## 2013-10-06 DIAGNOSIS — N840 Polyp of corpus uteri: Secondary | ICD-10-CM

## 2013-10-06 DIAGNOSIS — N926 Irregular menstruation, unspecified: Secondary | ICD-10-CM

## 2013-10-06 HISTORY — DX: Polyp of corpus uteri: N84.0

## 2013-10-06 NOTE — Patient Instructions (Signed)
Dilation and Curettage or Vacuum Curettage Dilation and curettage (D&C) and vacuum curettage are minor procedures. A D&C involves stretching (dilation) the cervix and scraping (curettage) the inside lining of the womb (uterus). During a D&C, tissue is gently scraped from the inside lining of the uterus. During a vacuum curettage, the lining and tissue in the uterus are removed with the use of gentle suction.  Curettage may be performed to either diagnose or treat a problem. As a diagnostic procedure, curettage is performed to examine tissues from the uterus. A diagnostic curettage may be performed for the following symptoms:   Irregular bleeding in the uterus.   Bleeding with the development of clots.   Spotting between menstrual periods.   Prolonged menstrual periods.   Bleeding after menopause.   No menstrual period (amenorrhea).   A change in size and shape of the uterus.  As a treatment procedure, curettage may be performed for the following reasons:   Removal of an IUD (intrauterine device).   Removal of retained placenta after giving birth. Retained placenta can cause an infection or bleeding severe enough to require transfusions.   Abortion.   Miscarriage.   Removal of polyps inside the uterus.   Removal of uncommon types of noncancerous lumps (fibroids).  LET [facility] CARE PROVIDER KNOW ABOUT:   Any allergies you have.   All medicines you are taking, including vitamins, herbs, eye drops, creams, and over-the-counter medicines.   Previous problems you or members of your family have had with the use of anesthetics.   Any blood disorders you have.   Previous surgeries you have had.   Medical conditions you have. RISKS AND COMPLICATIONS  Generally, this is a safe procedure. However, as with any procedure, complications can occur. Possible complications include:  Excessive bleeding.   Infection of the uterus.   Damage to the cervix.    Development of scar tissue (adhesions) inside the uterus, later causing abnormal amounts of menstrual bleeding.   Complications from the general anesthetic, if a general anesthetic is used.   Putting a hole (perforation) in the uterus. This is rare.  BEFORE THE PROCEDURE   Eat and drink before the procedure only as directed by your health care provider.   Arrange for someone to take you home.  PROCEDURE  This procedure usually takes about 15 30 minutes.  You will be given one of the following:  A medicine that numbs the area in and around the cervix (local anesthetic).   A medicine to make you sleep through the procedure (general anesthetic).  You will lie on your back with your legs in stirrups.   A warm metal or plastic instrument (speculum) will be placed in your vagina to keep it open and to allow the health care provider to see the cervix.  There are two ways in which your cervix can be softened and dilated. These include:   Taking a medicine.   Having thin rods (laminaria) inserted into your cervix.   A curved tool (curette) will be used to scrape cells from the inside lining of the uterus. In some cases, gentle suction is applied with the curette. The curette will then be removed.  AFTER THE PROCEDURE   You will rest in the recovery area until you are stable and are ready to go home.   You may feel sick to your stomach (nauseous) or throw up (vomit) if you were given a general anesthetic.   You may have a sore throat if a  tube was placed in your throat during general anesthesia.   You may have light cramping and bleeding. This may last for 2 days to 2 weeks after the procedure.   Your uterus needs to make a new lining after the procedure. This may make your next period late. Document Released: 06/15/2005 Document Revised: 02/15/2013 Document Reviewed: 01/12/2013 [facility] Patient Information 2014 [place], [place]. Return in 3 days for pre op with Dr  Elonda Husky Keep taking megace

## 2013-10-06 NOTE — Progress Notes (Signed)
Subjective:     Patient ID: Gracin Soohoo, female   DOB: [birth year], 33 y.o.   MRN: [id number]  HPI Carnell is a  33 year old Hispanic female, in for [place] for bleeding that was slowed by megace but not stopped,had endo BX that showed benign polyp and was given 2 units PRBC at [facility] earlier this year.She desires a child, has never been pregnant.  Review of Systems See HPI Reviewed past medical,surgical, social and family history. Reviewed medications and allergies.     Objective:   Physical Exam BP 120/70  Ht 5\' 5"  (1.651 m)  Wt 314 lb 9.6 oz (142.702 kg)  BMI 52.35 kg/m2  LMP 04/02/2015US reviewed with pt.   Uterus 9.7 x 7.4 x 5.1 cm, no myometrial masses noted  Endometrium 10.0 mm, asymmetrical, with 2 hyperechoic areas noted within cavity #1-21mm and #2-9mm  Right ovary 3.0 x 1.7 x 1.2 cm,  Left ovary 2.4 x 1.2 x 1.2 cm,  No free fluid or adnexal masses noted within pelvis  Technician Comments:  Anteverted uterus noted with 2 hyperechoic areas noted within endometrial cavity, bilateral adnexa/ovaries appears wnl no free fluid or adnexal masses noted within pelvis  Discussed needs D&C will refer to Dr Elonda Husky to do.   Assessment:     Menorrhagia Endometrial polyp    Plan:     Return in 3 days for pre op with Dr Elonda Husky   Review handout on [facility] Keep taking megace 40 mg 2 daily

## 2013-10-09 ENCOUNTER — Encounter (HOSPITAL_COMMUNITY): Payer: Self-pay | Admitting: Pharmacy Technician

## 2013-10-09 ENCOUNTER — Ambulatory Visit (INDEPENDENT_AMBULATORY_CARE_PROVIDER_SITE_OTHER): Payer: BC Managed Care – PPO | Admitting: Obstetrics & Gynecology

## 2013-10-09 ENCOUNTER — Encounter: Payer: Self-pay | Admitting: Obstetrics & Gynecology

## 2013-10-09 VITALS — BP 120/60 | Ht 65.0 in | Wt 311.0 lb

## 2013-10-09 DIAGNOSIS — N840 Polyp of corpus uteri: Secondary | ICD-10-CM

## 2013-10-09 DIAGNOSIS — N920 Excessive and frequent menstruation with regular cycle: Secondary | ICD-10-CM

## 2013-10-09 NOTE — Progress Notes (Signed)
Patient ID: Casey King, female   DOB: [birth year], 33 y.o.   MRN: [id number] Subjective:     Patient ID: Casey King, female   DOB: 1980/07/08, 33 y.o.   MRN: [id number]  Will proceed with hysteroscopy uterine curettage to remove endometrial polyps on 10/25/2013 HPI Casey King is a  33 year old Hispanic female, in for [place] for bleeding that was slowed by megace but not stopped,had endo BX that showed benign polyp and was given 2 units PRBC at [facility] earlier this year.She desires a child, has never been pregnant.  Review of Systems See HPI Reviewed past medical,surgical, social and family history. Reviewed medications and allergies.     Objective:   Physical Exam BP 120/60  Ht 5\' 5"  (1.651 m)  Wt 311 lb (141.069 kg)  BMI 51.75 kg/m2  LMP 04/02/2015US reviewed with pt.   Uterus 9.7 x 7.4 x 5.1 cm, no myometrial masses noted  Endometrium 10.0 mm, asymmetrical, with 2 hyperechoic areas noted within cavity #1-19mm and #2-67mm  Right ovary 3.0 x 1.7 x 1.2 cm,  Left ovary 2.4 x 1.2 x 1.2 cm,  No free fluid or adnexal masses noted within pelvis  Technician Comments:  Anteverted uterus noted with 2 hyperechoic areas noted within endometrial cavity, bilateral adnexa/ovaries appears wnl no free fluid or adnexal masses noted within pelvis  Discussed needs D&C will refer to Dr Elonda Husky to do.   Assessment:     Menorrhagia Endometrial polyp    Plan:     Return in 3 days for pre op with Dr Elonda Husky   Review handout on [facility] Keep taking megace 40 mg 2 daily

## 2013-10-17 NOTE — Patient Instructions (Signed)
Casey King  10/17/2013   Your procedure is scheduled on:   10/25/2013  Report to [facility] at  64  AM.  Call this number if you have problems the morning of surgery: [phone]   Remember:   Do not eat food or drink liquids after midnight.   Take these medicines the morning of surgery with A SIP OF WATER:  metoprolol   Do not wear jewelry, make-up or nail polish.  Do not wear lotions, powders, or perfumes.   Do not shave 48 hours prior to surgery. Men may shave face and neck.  Do not bring valuables to the hospital.  [facility] is not responsible for any belongings or valuables.               Contacts, dentures or bridgework may not be worn into surgery.  Leave suitcase in the car. After surgery it may be brought to your room.  For patients admitted to the hospital, discharge time is determined by your treatment team.               Patients discharged the day of surgery will not be allowed to drive home.  Name and phone number of your driver: family  Special Instructions: Shower using CHG 2 nights before surgery and the night before surgery.  If you shower the day of surgery use CHG.  Use special wash - you have one bottle of CHG for all showers.  You should use approximately 1/3 of the bottle for each shower.   Please read over the following fact sheets that you were given: Pain Booklet, Coughing and Deep Breathing, Surgical Site Infection Prevention, Anesthesia Post-op Instructions and Care and Recovery After Surgery Hysteroscopy Hysteroscopy is a procedure used for looking inside the womb (uterus). It may be done for various reasons, including:  To evaluate abnormal bleeding, fibroid (benign, noncancerous) tumors, polyps, scar tissue (adhesions), and possibly cancer of the uterus.  To look for lumps (tumors) and other uterine growths.  To look for causes of why a woman cannot get pregnant (infertility), causes of recurrent loss of pregnancy (miscarriages), or  a lost intrauterine device (IUD).  To perform a sterilization by blocking the fallopian tubes from inside the uterus. In this procedure, a thin, flexible tube with a tiny light and camera on the end of it (hysteroscope) is used to look inside the uterus. A hysteroscopy should be done right after a menstrual period to be sure you are not pregnant. LET [facility] CARE PROVIDER KNOW ABOUT:   Any allergies you have.  All medicines you are taking, including vitamins, herbs, eye drops, creams, and over-the-counter medicines.  Previous problems you or members of your family have had with the use of anesthetics.  Any blood disorders you have.  Previous surgeries you have had.  Medical conditions you have. RISKS AND COMPLICATIONS  Generally, this is a safe procedure. However, as with any procedure, complications can occur. Possible complications include:  Putting a hole in the uterus.  Excessive bleeding.  Infection.  Damage to the cervix.  Injury to other organs.  Allergic reaction to medicines.  Too much fluid used in the uterus for the procedure. BEFORE THE PROCEDURE   Ask your health care provider about changing or stopping any regular medicines.  Do not take aspirin or blood thinners for 1 week before the procedure, or as directed by your health care provider. These can cause bleeding.  If you smoke, do  not smoke for 2 weeks before the procedure.  In some cases, a medicine is placed in the cervix the day before the procedure. This medicine makes the cervix have a larger opening (dilate). This makes it easier for the instrument to be inserted into the uterus during the procedure.  Do not eat or drink anything for at least 8 hours before the surgery.  Arrange for someone to take you home after the procedure. PROCEDURE   You may be given a medicine to relax you (sedative). You may also be given one of the following:  A medicine that numbs the area around the cervix (local  anesthetic).  A medicine that makes you sleep through the procedure (general anesthetic).  The hysteroscope is inserted through the vagina into the uterus. The camera on the hysteroscope sends a picture to a TV screen. This gives the surgeon a good view inside the uterus.  During the procedure, air or a liquid is put into the uterus, which allows the surgeon to see better.  Sometimes, tissue is gently scraped from inside the uterus. These tissue samples are sent to a lab for testing. AFTER THE PROCEDURE   If you had a general anesthetic, you may be groggy for a couple hours after the procedure.  If you had a local anesthetic, you will be able to go home as soon as you are stable and feel ready.  You may have some cramping. This normally lasts for a couple days.  You may have bleeding, which varies from light spotting for a few days to menstrual-like bleeding for 3 7 days. This is normal.  If your test results are not back during the visit, make an appointment with your health care provider to find out the results. Document Released: 09/21/2000 Document Revised: 04/05/2013 Document Reviewed: 01/12/2013 [facility] Patient Information 2014 [place], [place]. Dilation and Curettage or Vacuum Curettage Dilation and curettage (D&C) and vacuum curettage are minor procedures. A D&C involves stretching (dilation) the cervix and scraping (curettage) the inside lining of the womb (uterus). During a D&C, tissue is gently scraped from the inside lining of the uterus. During a vacuum curettage, the lining and tissue in the uterus are removed with the use of gentle suction.  Curettage may be performed to either diagnose or treat a problem. As a diagnostic procedure, curettage is performed to examine tissues from the uterus. A diagnostic curettage may be performed for the following symptoms:   Irregular bleeding in the uterus.   Bleeding with the development of clots.   Spotting between menstrual  periods.   Prolonged menstrual periods.   Bleeding after menopause.   No menstrual period (amenorrhea).   A change in size and shape of the uterus.  As a treatment procedure, curettage may be performed for the following reasons:   Removal of an IUD (intrauterine device).   Removal of retained placenta after giving birth. Retained placenta can cause an infection or bleeding severe enough to require transfusions.   Abortion.   Miscarriage.   Removal of polyps inside the uterus.   Removal of uncommon types of noncancerous lumps (fibroids).  LET [facility] CARE PROVIDER KNOW ABOUT:   Any allergies you have.   All medicines you are taking, including vitamins, herbs, eye drops, creams, and over-the-counter medicines.   Previous problems you or members of your family have had with the use of anesthetics.   Any blood disorders you have.   Previous surgeries you have had.   Medical conditions you  have. RISKS AND COMPLICATIONS  Generally, this is a safe procedure. However, as with any procedure, complications can occur. Possible complications include:  Excessive bleeding.   Infection of the uterus.   Damage to the cervix.   Development of scar tissue (adhesions) inside the uterus, later causing abnormal amounts of menstrual bleeding.   Complications from the general anesthetic, if a general anesthetic is used.   Putting a hole (perforation) in the uterus. This is rare.  BEFORE THE PROCEDURE   Eat and drink before the procedure only as directed by your health care provider.   Arrange for someone to take you home.  PROCEDURE  This procedure usually takes about 15 30 minutes.  You will be given one of the following:  A medicine that numbs the area in and around the cervix (local anesthetic).   A medicine to make you sleep through the procedure (general anesthetic).  You will lie on your back with your legs in stirrups.   A warm metal or  plastic instrument (speculum) will be placed in your vagina to keep it open and to allow the health care provider to see the cervix.  There are two ways in which your cervix can be softened and dilated. These include:   Taking a medicine.   Having thin rods (laminaria) inserted into your cervix.   A curved tool (curette) will be used to scrape cells from the inside lining of the uterus. In some cases, gentle suction is applied with the curette. The curette will then be removed.  AFTER THE PROCEDURE   You will rest in the recovery area until you are stable and are ready to go home.   You may feel sick to your stomach (nauseous) or throw up (vomit) if you were given a general anesthetic.   You may have a sore throat if a tube was placed in your throat during general anesthesia.   You may have light cramping and bleeding. This may last for 2 days to 2 weeks after the procedure.   Your uterus needs to make a new lining after the procedure. This may make your next period late. Document Released: 06/15/2005 Document Revised: 02/15/2013 Document Reviewed: 01/12/2013 [facility] Patient Information 2014 [place], [place]. PATIENT INSTRUCTIONS POST-ANESTHESIA  IMMEDIATELY FOLLOWING SURGERY:  Do not drive or operate machinery for the first twenty four hours after surgery.  Do not make any important decisions for twenty four hours after surgery or while taking narcotic pain medications or sedatives.  If you develop intractable nausea and vomiting or a severe headache please notify your doctor immediately.  FOLLOW-UP:  Please make an appointment with your surgeon as instructed. You do not need to follow up with anesthesia unless specifically instructed to do so.  WOUND CARE INSTRUCTIONS (if applicable):  Keep a dry clean dressing on the anesthesia/puncture wound site if there is drainage.  Once the wound has quit draining you may leave it open to air.  Generally you should leave the bandage  intact for twenty four hours unless there is drainage.  If the epidural site drains for more than 36-48 hours please call the anesthesia department.  QUESTIONS?:  Please feel free to call your physician or the hospital operator if you have any questions, and they will be happy to assist you.

## 2013-10-18 ENCOUNTER — Encounter (HOSPITAL_COMMUNITY): Payer: Self-pay

## 2013-10-18 ENCOUNTER — Other Ambulatory Visit: Payer: Self-pay

## 2013-10-18 ENCOUNTER — Encounter (HOSPITAL_COMMUNITY)
Admission: RE | Admit: 2013-10-18 | Discharge: 2013-10-18 | Disposition: A | Discharge status: Home / Self Care | Payer: BC Managed Care – PPO | Source: Ambulatory Visit | Attending: Obstetrics & Gynecology | Admitting: Obstetrics & Gynecology

## 2013-10-18 DIAGNOSIS — Z01812 Encounter for preprocedural laboratory examination: Secondary | ICD-10-CM | POA: Insufficient documentation

## 2013-10-18 LAB — HCG, QUANTITATIVE, PREGNANCY: hCG, Beta Chain, Quant, S: <1 m[IU]/mL (ref <5)

## 2013-10-18 LAB — URINALYSIS, ROUTINE W REFLEX MICROSCOPIC
BILIRUBIN URINE: NEGATIVE
Glucose, UA: NEGATIVE mg/dL
HGB URINE DIPSTICK: NEGATIVE
Ketones, ur: NEGATIVE mg/dL
Leukocytes, UA: NEGATIVE
Nitrite: NEGATIVE
Protein, ur: NEGATIVE mg/dL
SPECIFIC GRAVITY, URINE: 1.01 (ref 1.005–1.030)
Urobilinogen, UA: 0.2 mg/dL (ref 0.0–1.0)
pH: 6.5 (ref 5.0–8.0)

## 2013-10-18 LAB — COMPREHENSIVE METABOLIC PANEL
ALK PHOS: 28 U/L — AB (ref 39–117)
ALT: 10 U/L (ref 0–35)
AST: 13 U/L (ref 0–37)
Albumin: 3.5 g/dL (ref 3.5–5.2)
BUN: 11 mg/dL (ref 6–23)
CO2: 24 mEq/L (ref 19–32)
Calcium: 9.2 mg/dL (ref 8.4–10.5)
Chloride: 106 mEq/L (ref 96–112)
Creatinine, Ser: 0.67 mg/dL (ref 0.50–1.10)
GFR calc non Af Amer: >90 mL/min (ref >90)
GLUCOSE: 87 mg/dL (ref 70–99)
POTASSIUM: 4.5 meq/L (ref 3.7–5.3)
SODIUM: 143 meq/L (ref 137–147)
Total Bilirubin: 0.3 mg/dL (ref 0.3–1.2)
Total Protein: 6.6 g/dL (ref 6.0–8.3)

## 2013-10-18 LAB — CBC
HCT: 33.9 % — ABNORMAL LOW (ref 36.0–46.0)
HEMOGLOBIN: 10.5 g/dL — AB (ref 12.0–15.0)
MCH: 23.2 pg — ABNORMAL LOW (ref 26.0–34.0)
MCHC: 31 g/dL (ref 30.0–36.0)
MCV: 74.8 fL — ABNORMAL LOW (ref 78.0–100.0)
Platelets: 292 10*3/uL (ref 150–400)
RBC: 4.53 MIL/uL (ref 3.87–5.11)
RDW: 16 % — ABNORMAL HIGH (ref 11.5–15.5)
WBC: 7.8 10*3/uL (ref 4.0–10.5)

## 2013-10-18 MED ORDER — SIMETHICONE 40 MG/0.6ML PO SUSP
ORAL | Status: AC
  Filled 2013-10-18: qty 1.8

## 2013-10-24 ENCOUNTER — Encounter (HOSPITAL_COMMUNITY)
Admission: RE | Admit: 2013-10-24 | Discharge: 2013-10-24 | Disposition: A | Discharge status: Home / Self Care | Payer: BC Managed Care – PPO | Source: Ambulatory Visit | Attending: Obstetrics & Gynecology | Admitting: Obstetrics & Gynecology

## 2013-10-25 ENCOUNTER — Encounter (HOSPITAL_COMMUNITY): Payer: Self-pay | Admitting: *Deleted

## 2013-10-25 ENCOUNTER — Ambulatory Visit (HOSPITAL_COMMUNITY)
Admission: RE | Admit: 2013-10-25 | Discharge: 2013-10-25 | Disposition: A | Discharge status: Home / Self Care | Payer: BC Managed Care – PPO | Source: Ambulatory Visit | Attending: Obstetrics & Gynecology | Admitting: Obstetrics & Gynecology

## 2013-10-25 ENCOUNTER — Encounter (HOSPITAL_COMMUNITY): Payer: BC Managed Care – PPO | Admitting: Anesthesiology

## 2013-10-25 ENCOUNTER — Ambulatory Visit (HOSPITAL_COMMUNITY): Payer: BC Managed Care – PPO | Admitting: Anesthesiology

## 2013-10-25 ENCOUNTER — Encounter (HOSPITAL_COMMUNITY)
Admission: RE | Disposition: A | Discharge status: Home / Self Care | Payer: Self-pay | Source: Ambulatory Visit | Attending: Obstetrics & Gynecology

## 2013-10-25 DIAGNOSIS — Z79899 Other long term (current) drug therapy: Secondary | ICD-10-CM | POA: Insufficient documentation

## 2013-10-25 DIAGNOSIS — N840 Polyp of corpus uteri: Secondary | ICD-10-CM | POA: Insufficient documentation

## 2013-10-25 DIAGNOSIS — Z87891 Personal history of nicotine dependence: Secondary | ICD-10-CM | POA: Insufficient documentation

## 2013-10-25 DIAGNOSIS — N920 Excessive and frequent menstruation with regular cycle: Secondary | ICD-10-CM

## 2013-10-25 DIAGNOSIS — N946 Dysmenorrhea, unspecified: Secondary | ICD-10-CM | POA: Insufficient documentation

## 2013-10-25 DIAGNOSIS — Z01812 Encounter for preprocedural laboratory examination: Secondary | ICD-10-CM | POA: Insufficient documentation

## 2013-10-25 DIAGNOSIS — I10 Essential (primary) hypertension: Secondary | ICD-10-CM | POA: Insufficient documentation

## 2013-10-25 HISTORY — PX: POLYPECTOMY: SHX5525

## 2013-10-25 HISTORY — PX: HYSTEROSCOPY W/D&C: SHX1775

## 2013-10-25 SURGERY — DILATATION AND CURETTAGE /HYSTEROSCOPY
Anesthesia: General | Site: Vagina

## 2013-10-25 MED ORDER — HYDROCODONE-ACETAMINOPHEN 5-325 MG PO TABS: 1.0000 | ORAL_TABLET | Freq: Four times a day (QID) | ORAL | Status: DC | PRN

## 2013-10-25 MED ORDER — LIDOCAINE HCL (PF) 1 % IJ SOLN
INTRAMUSCULAR | Status: AC
  Filled 2013-10-25: qty 2

## 2013-10-25 MED ORDER — 0.9 % SODIUM CHLORIDE (POUR BTL) OPTIME
TOPICAL | Status: DC | PRN
  Administered 2013-10-25: 500 mL

## 2013-10-25 MED ORDER — PROPOFOL 10 MG/ML IV BOLUS
INTRAVENOUS | Status: DC | PRN
  Administered 2013-10-25: 200 mg via INTRAVENOUS

## 2013-10-25 MED ORDER — KETOROLAC TROMETHAMINE 30 MG/ML IJ SOLN
INTRAMUSCULAR | Status: AC
  Filled 2013-10-25: qty 1

## 2013-10-25 MED ORDER — CEFAZOLIN SODIUM 1-5 GM-% IV SOLN
INTRAVENOUS | Status: AC
  Filled 2013-10-25: qty 50

## 2013-10-25 MED ORDER — FENTANYL CITRATE 0.05 MG/ML IJ SOLN
25.0000 ug | INTRAMUSCULAR | Status: DC | PRN
  Administered 2013-10-25: 50 ug via INTRAVENOUS
  Filled 2013-10-25: qty 2

## 2013-10-25 MED ORDER — LIDOCAINE HCL 1 % IJ SOLN
INTRAMUSCULAR | Status: DC | PRN
  Administered 2013-10-25: 30 mg via INTRADERMAL

## 2013-10-25 MED ORDER — ONDANSETRON HCL 4 MG/2ML IJ SOLN
4.0000 mg | Freq: Once | INTRAMUSCULAR | Status: AC
  Administered 2013-10-25: 4 mg via INTRAVENOUS

## 2013-10-25 MED ORDER — GLYCOPYRROLATE 0.2 MG/ML IJ SOLN
INTRAMUSCULAR | Status: AC
  Filled 2013-10-25: qty 1

## 2013-10-25 MED ORDER — MIDAZOLAM HCL 2 MG/2ML IJ SOLN
1.0000 mg | INTRAMUSCULAR | Status: DC | PRN
  Administered 2013-10-25 (×2): 2 mg via INTRAVENOUS

## 2013-10-25 MED ORDER — MIDAZOLAM HCL 2 MG/2ML IJ SOLN
INTRAMUSCULAR | Status: AC
  Filled 2013-10-25: qty 2

## 2013-10-25 MED ORDER — CEFAZOLIN SODIUM-DEXTROSE 2-3 GM-% IV SOLR
2.0000 g | INTRAVENOUS | Status: AC
  Administered 2013-10-25: 2 g via INTRAVENOUS
  Administered 2013-10-25: 1 g via INTRAVENOUS
  Filled 2013-10-25: qty 50

## 2013-10-25 MED ORDER — FENTANYL CITRATE 0.05 MG/ML IJ SOLN
INTRAMUSCULAR | Status: AC
  Filled 2013-10-25: qty 2

## 2013-10-25 MED ORDER — CEFAZOLIN SODIUM 1-5 GM-% IV SOLN: 1.0000 g | INTRAVENOUS | Status: DC

## 2013-10-25 MED ORDER — GLYCOPYRROLATE 0.2 MG/ML IJ SOLN
0.2000 mg | Freq: Once | INTRAMUSCULAR | Status: AC
  Administered 2013-10-25: 0.2 mg via INTRAVENOUS

## 2013-10-25 MED ORDER — SODIUM CHLORIDE 0.9 % IV SOLN
INTRAVENOUS | Status: DC | PRN
  Administered 2013-10-25: 1000 mL via INTRAMUSCULAR

## 2013-10-25 MED ORDER — PROPOFOL 10 MG/ML IV BOLUS
INTRAVENOUS | Status: AC
  Filled 2013-10-25: qty 20

## 2013-10-25 MED ORDER — KETOROLAC TROMETHAMINE 10 MG PO TABS: 10.0000 mg | ORAL_TABLET | Freq: Three times a day (TID) | ORAL | Status: DC | PRN

## 2013-10-25 MED ORDER — DIPHENHYDRAMINE HCL 50 MG/ML IJ SOLN
INTRAMUSCULAR | Status: AC
  Filled 2013-10-25: qty 1

## 2013-10-25 MED ORDER — LACTATED RINGERS IV SOLN
INTRAVENOUS | Status: DC
  Administered 2013-10-25: 11:00:00 via INTRAVENOUS

## 2013-10-25 MED ORDER — DEXTROSE 5 % IV SOLN: 3.0000 g | INTRAVENOUS | Status: DC

## 2013-10-25 MED ORDER — ONDANSETRON HCL 4 MG/2ML IJ SOLN
INTRAMUSCULAR | Status: AC
  Filled 2013-10-25: qty 2

## 2013-10-25 MED ORDER — FENTANYL CITRATE 0.05 MG/ML IJ SOLN
INTRAMUSCULAR | Status: DC | PRN
  Administered 2013-10-25: 50 ug via INTRAVENOUS

## 2013-10-25 MED ORDER — ONDANSETRON HCL 4 MG/2ML IJ SOLN: 4.0000 mg | Freq: Once | INTRAMUSCULAR | Status: DC | PRN

## 2013-10-25 MED ORDER — ONDANSETRON HCL 8 MG PO TABS: 8.0000 mg | ORAL_TABLET | Freq: Three times a day (TID) | ORAL | Status: DC | PRN

## 2013-10-25 MED ORDER — DIPHENHYDRAMINE HCL 50 MG/ML IJ SOLN
25.0000 mg | Freq: Once | INTRAMUSCULAR | Status: AC
  Administered 2013-10-25: 25 mg via INTRAVENOUS

## 2013-10-25 MED ORDER — KETOROLAC TROMETHAMINE 30 MG/ML IJ SOLN
30.0000 mg | Freq: Once | INTRAMUSCULAR | Status: AC
  Administered 2013-10-25: 30 mg via INTRAVENOUS

## 2013-10-25 SURGICAL SUPPLY — 29 items
BAG HAMPER (MISCELLANEOUS) ×3 IMPLANT
CLOTH BEACON ORANGE TIMEOUT ST (SAFETY) ×3 IMPLANT
COVER LIGHT HANDLE STERIS (MISCELLANEOUS) ×6 IMPLANT
FORMALIN 10 PREFIL 120ML (MISCELLANEOUS) IMPLANT
FORMALIN 10 PREFIL 480ML (MISCELLANEOUS) IMPLANT
GAUZE SPONGE 4X4 16PLY XRAY LF (GAUZE/BANDAGES/DRESSINGS) ×3 IMPLANT
GLOVE BIOGEL PI IND STRL 7.0 (GLOVE) ×2 IMPLANT
GLOVE BIOGEL PI IND STRL 8 (GLOVE) ×1 IMPLANT
GLOVE BIOGEL PI INDICATOR 7.0 (GLOVE) ×4
GLOVE BIOGEL PI INDICATOR 8 (GLOVE) ×2
GLOVE ECLIPSE 8.0 STRL XLNG CF (GLOVE) ×3 IMPLANT
GLOVE SS BIOGEL STRL SZ 6.5 (GLOVE) ×1 IMPLANT
GLOVE SUPERSENSE BIOGEL SZ 6.5 (GLOVE) ×2
GOWN STRL REUS W/TWL LRG LVL3 (GOWN DISPOSABLE) ×6 IMPLANT
GOWN STRL REUS W/TWL XL LVL3 (GOWN DISPOSABLE) ×3 IMPLANT
INST SET HYSTEROSCOPY (KITS) ×3 IMPLANT
IV NS 1000ML (IV SOLUTION) ×2
IV NS 1000ML BAXH (IV SOLUTION) ×1 IMPLANT
KIT ROOM TURNOVER AP CYSTO (KITS) ×3 IMPLANT
MANIFOLD NEPTUNE II (INSTRUMENTS) ×3 IMPLANT
NS IRRIG 1000ML POUR BTL (IV SOLUTION) ×3 IMPLANT
PACK BASIC III (CUSTOM PROCEDURE TRAY) ×2
PACK SRG BSC III STRL LF ECLPS (CUSTOM PROCEDURE TRAY) ×1 IMPLANT
PAD ARMBOARD 7.5X6 YLW CONV (MISCELLANEOUS) ×3 IMPLANT
PAD TELFA 3X4 1S STER (GAUZE/BANDAGES/DRESSINGS) ×3 IMPLANT
SET BASIN LINEN APH (SET/KITS/TRAYS/PACK) ×3 IMPLANT
SET IRRIG Y TYPE TUR BLADDER L (SET/KITS/TRAYS/PACK) ×3 IMPLANT
SHEET LAVH (DRAPES) ×3 IMPLANT
YANKAUER SUCT BULB TIP 10FT TU (MISCELLANEOUS) ×3 IMPLANT

## 2013-10-25 NOTE — Transfer of Care (Signed)
Immediate Anesthesia Transfer of Care Note  Patient: Casey King  Procedure(s) Performed: Procedure(s): DILATATION AND CURETTAGE /HYSTEROSCOPY (N/A) POLYPECTOMY (endometrial) (N/A)  Patient Location: PACU  Anesthesia Type:General  Level of Consciousness: awake, alert  and oriented  Airway & Oxygen Therapy: Patient Spontanous Breathing and Patient connected to face mask oxygen  Post-op Assessment: Report given to PACU RN  Post vital signs: Reviewed and stable  Complications: No apparent anesthesia complications

## 2013-10-25 NOTE — Op Note (Signed)
Preoperative diagnosis: Menometrorrhagia                                        Dysmenorrhea   Postoperative diagnoses: Same as above   Procedure: Hysteroscopy, uterine curettage,   Surgeon: Elonda Husky MD  Anesthesia: Laryngeal mask airway  Findings: The endometrium was noteworthy for being quite lusn and 2 endoemetrial polyps. There were no fibroid or other abnormalities.  Description of operation: The patient was taken to the operating room and placed in the supine position. She underwent general anesthesia using the laryngeal mask airway. She was placed in the dorsal lithotomy position and prepped and draped in the usual sterile fashion. A Graves speculum was placed and the anterior cervical lip was grasped with a single-tooth tenaculum. The cervix was dilated serially to allow passage of the hysteroscope. Diagnostic hysteroscopy was performed and was found to be normal except for the 2 small polyps. A vigorous uterine curettage was then performed and all tissue sent to pathology for evaluation.   The patient was awakened from anesthesia and taken to the recovery room in good stable condition all counts were correct. She received 3g of Ancef and 30 mg of Toradol preoperatively. She will be discharged from the recovery room and followed up in the office in 1- 2 weeks.  Florian Buff 10/25/2013 12:45 PM

## 2013-10-25 NOTE — Discharge Instructions (Signed)
Dilation and Curettage or Vacuum Curettage, Care After  Refer to this sheet in the next few weeks. These instructions provide you with information on caring for yourself after your procedure. Your health care provider may also give you more specific instructions. Your treatment has been planned according to current medical practices, but problems sometimes occur. Call your health care provider if you have any problems or questions after your procedure.  WHAT TO EXPECT AFTER THE PROCEDURE  After your procedure, it is typical to have light cramping and bleeding. This may last for 2 days to 2 weeks after the procedure.  HOME CARE INSTRUCTIONS   · Do not drive for 24 hours.  · Wait 1 week before returning to strenuous activities.  · Take your temperature 2 times a day for 4 days and write it down. Provide these temperatures to your health care provider if you develop a fever.  · Avoid long periods of standing.  · Avoid heavy lifting, pushing, or pulling. Do not lift anything heavier than 10 pounds (4.5 kg).  · Limit stair climbing to once or twice a day.  · Take rest periods often.  · You may resume your usual diet.  · Drink enough fluids to keep your urine clear or pale yellow.  · Your usual bowel function should return. If you have constipation, you may:  · Take a mild laxative with permission from your health care provider.  · Add fruit and bran to your diet.  · Drink more fluids.  · Take showers instead of baths until your health care provider gives you permission to take baths.  · Do not go swimming or use a hot tub until your health care provider approves.  · Try to have someone with you or available to you the first 24 48 hours, especially if you were given a general anesthetic.  · Do not douche, use tampons, or have intercourse for 2 weeks after the procedure.  · Only take over-the-counter or prescription medicines as directed by your health care provider. Do not take aspirin. It can cause bleeding.  · Follow up  with your health care provider as directed.  SEEK MEDICAL CARE IF:   · You have increasing cramps or pain that is not relieved with medicine.  · You have abdominal pain that does not seem to be related to the same area of earlier cramping and pain.  · You have bad smelling vaginal discharge.  · You have a rash.  · You are having problems with any medicine.  SEEK IMMEDIATE MEDICAL CARE IF:   · You have bleeding that is heavier than a normal menstrual period.  · You have a fever.  · You have chest pain.  · You have shortness of breath.  · You feel dizzy or feel like fainting.  · You pass out.  · You have pain in your shoulder strap area.  · You have heavy vaginal bleeding with or without blood clots.  Document Released: 06/12/2000 Document Revised: 04/05/2013 Document Reviewed: 01/12/2013  [employer]® Patient Information ©2014 [employer].

## 2013-10-25 NOTE — H&P (Signed)
Preoperative History and Physical  Casey King is a 33 y.o. G0P0 with Patient's last menstrual period was 09/28/2013. admitted for a removal of endometrial poly(s) due to menometrorrhagia.    PMH:    Past Medical History  Diagnosis Date  . Morbid obesity   . Hypertension   . Anemia   . Palpitations   . History of hypothyroidism   . Chest pain   . Fatigue   . History of anemia 08/29/2013  . Dysmenorrhea 10/02/2013  . Endometrial polyp 10/06/2013  . SOB (shortness of breath)     pt states "i think it comes from my anemia"    PSH:     Past Surgical History  Procedure Laterality Date  . Endometrial biopsy    . Dilation and curettage of uterus      POb/GynH:      OB History   Grav Para Term Preterm Abortions TAB SAB Ect Mult Living   0               SH:   History  Substance Use Topics  . Smoking status: Former Smoker -- 5 years  . Smokeless tobacco: Never Used     Comment: hx of "social smoker"   . Alcohol Use: No    FH:    Family History  Problem Relation Age of Onset  . Cancer Mother     Breast Cancer  . Hypertension Father   . Diabetes Father   . Cancer Sister     breast  . Cancer Maternal Grandmother     breast  . Hypertension Sister      Allergies: No Known Allergies  Medications:      Current facility-administered medications:ceFAZolin (ANCEF) IVPB 1 g/50 mL premix, 1 g, Intravenous, On Call, Florian Buff, MD;  ceFAZolin (ANCEF) IVPB 2 g/50 mL premix, 2 g, Intravenous, On Call, Florian Buff, MD;  ketorolac (TORADOL) 30 MG/ML injection 30 mg, 30 mg, Intravenous, Once, Florian Buff, MD;  lactated ringers infusion, , Intravenous, Continuous, Brandy Hale, MD, Last Rate: 50 mL/hr at 10/25/13 1121 midazolam (VERSED) injection 1-2 mg, 1-2 mg, Intravenous, Q5 Min x 3 PRN, Brandy Hale, MD, 2 mg at 10/25/13 1125;  ondansetron (ZOFRAN) injection 4 mg, 4 mg, Intravenous, Once, Brandy Hale, MD  Review of Systems:   Review of Systems   Constitutional: Negative for fever, chills, weight loss, malaise/fatigue and diaphoresis.  HENT: Negative for hearing loss, ear pain, nosebleeds, congestion, sore throat, neck pain, tinnitus and ear discharge.   Eyes: Negative for blurred vision, double vision, photophobia, pain, discharge and redness.  Respiratory: Negative for cough, hemoptysis, sputum production, shortness of breath, wheezing and stridor.   Cardiovascular: Negative for chest pain, palpitations, orthopnea, claudication, leg swelling and PND.  Gastrointestinal: Negativefor abdominal pain. Negative for heartburn, nausea, vomiting, diarrhea, constipation, blood in stool and melena.  Genitourinary: Negative for dysuria, urgency, frequency, hematuria and flank pain.  Musculoskeletal: Negative for myalgias, back pain, joint pain and falls.  Skin: Negative for itching and rash.  Neurological: Negative for dizziness, tingling, tremors, sensory change, speech change, focal weakness, seizures, loss of consciousness, weakness and headaches.  Endo/Heme/Allergies: Negative for environmental allergies and polydipsia. Does not bruise/bleed easily.  Psychiatric/Behavioral: Negative for depression, suicidal ideas, hallucinations, memory loss and substance abuse. The patient is not nervous/anxious and does not have insomnia.      PHYSICAL EXAM:  Pulse 62, temperature 98.2 F (36.8 C), temperature source Oral, resp. rate 18, last menstrual period 09/28/2013,  SpO2 100.00%.    Vitals reviewed. Constitutional: She is oriented to person, place, and time. She appears well-developed and well-nourished.  HENT:  Head: Normocephalic and atraumatic.  Right Ear: External ear normal.  Left Ear: External ear normal.  Nose: Nose normal.  Mouth/Throat: Oropharynx is clear and moist.  Eyes: Conjunctivae and EOM are normal. Pupils are equal, round, and reactive to light. Right eye exhibits no discharge. Left eye exhibits no discharge. No scleral  icterus.  Neck: Normal range of motion. Neck supple. No tracheal deviation present. No thyromegaly present.  Cardiovascular: Normal rate, regular rhythm, normal heart sounds and intact distal pulses.  Exam reveals no gallop and no friction rub.   No murmur heard. Respiratory: Effort normal and breath sounds normal. No respiratory distress. She has no wheezes. She has no rales. She exhibits no tenderness.  GI: Soft. Bowel sounds are normal. She exhibits no distension and no mass. There is tenderness. There is no rebound and no guarding.  Genitourinary:       Vulva is normal without lesions Vagina is pink moist without discharge Cervix normal in appearance and pap is normal Uterus is normal size with endometrial polyp Adnexa is negative with normal sized ovaries by sonogram  Musculoskeletal: Normal range of motion. She exhibits no edema and no tenderness.  Neurological: She is alert and oriented to person, place, and time. She has normal reflexes. She displays normal reflexes. No cranial nerve deficit. She exhibits normal muscle tone. Coordination normal.  Skin: Skin is warm and dry. No rash noted. No erythema. No pallor.  Psychiatric: She has a normal mood and affect. Her behavior is normal. Judgment and thought content normal.    Labs: Results for orders placed during the hospital encounter of 10/24/13 (from the past 336 hour(s))  TYPE AND SCREEN   Collection Time    10/24/13  1:55 PM      Result Value Ref Range   ABO/RH(D) O POS     Antibody Screen POS     Sample Expiration 10/27/2013     DAT, IgG NEG     Antibody Identification ANTI c     PT AG Type NEGATIVE FOR c ANTIGEN     Unit Number [id number]     Blood Component Type RED CELLS,LR     Unit division 00     Status of Unit ALLOCATED     Transfusion Status OK TO TRANSFUSE     Crossmatch Result COMPATIBLE     Unit Number [id number]     Blood Component Type RED CELLS,LR     Unit division 00     Status of Unit ALLOCATED      Transfusion Status OK TO TRANSFUSE     Crossmatch Result       Value: COMPATIBLE     Performed at [facility]  Results for orders placed during the hospital encounter of 10/18/13 (from the past 336 hour(s))  URINALYSIS, ROUTINE W REFLEX MICROSCOPIC   Collection Time    10/18/13  8:20 AM      Result Value Ref Range   Color, Urine YELLOW  YELLOW   APPearance CLEAR  CLEAR   Specific Gravity, Urine 1.010  1.005 - 1.030   pH 6.5  5.0 - 8.0   Glucose, UA NEGATIVE  NEGATIVE mg/dL   Hgb urine dipstick NEGATIVE  NEGATIVE   Bilirubin Urine NEGATIVE  NEGATIVE   Ketones, ur NEGATIVE  NEGATIVE mg/dL   Protein, ur NEGATIVE  NEGATIVE mg/dL  Urobilinogen, UA 0.2  0.0 - 1.0 mg/dL   Nitrite NEGATIVE  NEGATIVE   Leukocytes, UA NEGATIVE  NEGATIVE  CBC   Collection Time    10/18/13  9:00 AM      Result Value Ref Range   WBC 7.8  4.0 - 10.5 K/uL   RBC 4.53  3.87 - 5.11 MIL/uL   Hemoglobin 10.5 (*) 12.0 - 15.0 g/dL   HCT 33.9 (*) 36.0 - 46.0 %   MCV 74.8 (*) 78.0 - 100.0 fL   MCH 23.2 (*) 26.0 - 34.0 pg   MCHC 31.0  30.0 - 36.0 g/dL   RDW 16.0 (*) 11.5 - 15.5 %   Platelets 292  150 - 400 K/uL  COMPREHENSIVE METABOLIC PANEL   Collection Time    10/18/13  9:00 AM      Result Value Ref Range   Sodium 143  137 - 147 mEq/L   Potassium 4.5  3.7 - 5.3 mEq/L   Chloride 106  96 - 112 mEq/L   CO2 24  19 - 32 mEq/L   Glucose, Bld 87  70 - 99 mg/dL   BUN 11  6 - 23 mg/dL   Creatinine, Ser 0.67  0.50 - 1.10 mg/dL   Calcium 9.2  8.4 - 10.5 mg/dL   Total Protein 6.6  6.0 - 8.3 g/dL   Albumin 3.5  3.5 - 5.2 g/dL   AST 13  0 - 37 U/L   ALT 10  0 - 35 U/L   Alkaline Phosphatase 28 (*) 39 - 117 U/L   Total Bilirubin 0.3  0.3 - 1.2 mg/dL   GFR calc non Af Amer >90  >90 mL/min   GFR calc Af Amer >90  >90 mL/min  HCG, QUANTITATIVE, PREGNANCY   Collection Time    10/18/13  9:12 AM      Result Value Ref Range   hCG, Beta Chain, Quant, S <1  <5 mIU/mL    EKG: Orders placed during the  hospital encounter of 10/18/13  . EKG 12-LEAD  . EKG 12-LEAD    Imaging Studies: US Transvaginal Non-ob  11/03/13   GYNECOLOGIC SONOGRAM   Miko Markwood is a 33 y.o. G0P0 09/28/2013 for a pelvic sonogram for  menorrhagia.  Uterus                      9.7 x 7.4 x 5.1 cm, no myometrial masses noted    Endometrium          10.0 mm, asymmetrical, with 2 hyperechoic areas noted  within cavity #1-44mm and #2-35mm  Right ovary             3.0 x 1.7 x 1.2 cm,   Left ovary                2.4 x 1.2 x 1.2 cm,   No free fluid or adnexal masses noted within pelvis  Technician Comments:  Anteverted uterus noted with 2 hyperechoic areas noted within endometrial  cavity, bilateral adnexa/ovaries appears wnl no free fluid or adnexal  masses noted within pelvis   Alicia Amel 10/06/2013 9:25 AM  Clinical Impression and recommendations:  I have reviewed the sonogram results above.  Combined with the patient's current clinical course, below are my  impressions and any appropriate recommendations for management based on  the sonographic findings:  1. 2 small areas distinct on 3-D u/s suggestive of polyps. 2. Consultation for management for excision of polyps  recommended.   Jonnie Kind       Assessment: Patient Active Problem List   Diagnosis Date Noted  . Endometrial polyp 10/06/2013  . Dysmenorrhea 10/02/2013  . History of anemia 08/29/2013  . DUB (dysfunctional uterine bleeding) 08/03/2013  . Morbid obesity 08/03/2013  . Menorrhagia 07/11/2013  . Fatigue     Plan: Hysteroscopic removal of endometrial polyps and uterine curettage  Florian Buff 10/25/2013 11:36 AM

## 2013-10-25 NOTE — Addendum Note (Signed)
Addendum created 10/25/13 1616 by Brandy Hale, MD   Modules edited: Anesthesia Attestations

## 2013-10-25 NOTE — Anesthesia Postprocedure Evaluation (Signed)
  Anesthesia Post-op Note  Patient: Casey King  Procedure(s) Performed: Procedure(s): DILATATION AND CURETTAGE /HYSTEROSCOPY (N/A) POLYPECTOMY (endometrial) (N/A)  Patient Location: PACU  Anesthesia Type:General  Level of Consciousness: awake, alert  and oriented  Airway and Oxygen Therapy: Patient Spontanous Breathing and Patient connected to face mask oxygen  Post-op Pain: mild  Post-op Assessment: Post-op Vital signs reviewed, Patient's Cardiovascular Status Stable, Respiratory Function Stable, Patent Airway and No signs of Nausea or vomiting  Post-op Vital Signs: Reviewed and stable  Last Vitals:  Filed Vitals:   10/25/13 1202  BP: 125/85  Pulse:   Temp:   Resp: 33    Complications: No apparent anesthesia complications

## 2013-10-25 NOTE — Anesthesia Preprocedure Evaluation (Signed)
Anesthesia Evaluation  Patient identified by MRN, date of birth, ID band Patient awake    Reviewed: Allergy & Precautions, H&P , NPO status , Patient's Chart, lab work & pertinent test results  Airway Mallampati: I TM Distance: >3 FB Neck ROM: Full    Dental  (+) Teeth Intact   Pulmonary shortness of breath, former smoker,    Pulmonary exam normal       Cardiovascular hypertension, Pt. on home beta blockers + dysrhythmias Supra Ventricular Tachycardia Rhythm:Regular Rate:Normal     Neuro/Psych negative neurological ROS  negative psych ROS   GI/Hepatic   Endo/Other  Hypothyroidism Morbid obesity  Renal/GU      Musculoskeletal negative musculoskeletal ROS (+)   Abdominal (+) + obese,  Abdomen: soft.    Peds  Hematology  (+) anemia ,   Anesthesia Other Findings   Reproductive/Obstetrics negative OB ROS                           Anesthesia Physical Anesthesia Plan  ASA: II  Anesthesia Plan: General   Post-op Pain Management:    Induction: Intravenous  Airway Management Planned: LMA  Additional Equipment:   Intra-op Plan:   Post-operative Plan: Extubation in OR  Informed Consent: I have reviewed the patients History and Physical, chart, labs and discussed the procedure including the risks, benefits and alternatives for the proposed anesthesia with the patient or authorized representative who has indicated his/her understanding and acceptance.     Plan Discussed with: CRNA  Anesthesia Plan Comments:         Anesthesia Quick Evaluation

## 2013-10-26 ENCOUNTER — Encounter (HOSPITAL_COMMUNITY): Payer: Self-pay | Admitting: Obstetrics & Gynecology

## 2013-10-27 LAB — TYPE AND SCREEN
ABO/RH(D): O POS
ANTIBODY SCREEN: POSITIVE
DAT, IGG: NEGATIVE
PT AG Type: NEGATIVE
UNIT DIVISION: 0
Unit division: 0

## 2013-11-01 ENCOUNTER — Encounter: Payer: Self-pay | Admitting: Obstetrics & Gynecology

## 2013-11-01 ENCOUNTER — Ambulatory Visit (INDEPENDENT_AMBULATORY_CARE_PROVIDER_SITE_OTHER): Payer: BC Managed Care – PPO | Admitting: Obstetrics & Gynecology

## 2013-11-01 VITALS — BP 100/70 | Ht 65.0 in | Wt 320.0 lb

## 2013-11-01 DIAGNOSIS — Z9889 Other specified postprocedural states: Secondary | ICD-10-CM

## 2013-11-01 DIAGNOSIS — N840 Polyp of corpus uteri: Secondary | ICD-10-CM

## 2013-11-01 NOTE — Progress Notes (Signed)
Patient ID: Casey King, female   DOB: [birth year], 33 y.o.   MRN: [id number] Pt has done well post operatively Minimal bleeding Minimal cramping  Exam Normal post op  Pathology is pending, outside consultation read is being done  Will call pt when returns  Past Medical History  Diagnosis Date  . Morbid obesity   . Hypertension   . Anemia   . Palpitations   . History of hypothyroidism   . Chest pain   . Fatigue   . History of anemia 08/29/2013  . Dysmenorrhea 10/02/2013  . Endometrial polyp 10/06/2013  . SOB (shortness of breath)     pt states "i think it comes from my anemia"    Past Surgical History  Procedure Laterality Date  . Endometrial biopsy    . Dilation and curettage of uterus    . Hysteroscopy w/d&c N/A 10/25/2013    Procedure: DILATATION AND CURETTAGE /HYSTEROSCOPY;  Surgeon: Florian Buff, MD;  Location: [facility];  Service: Gynecology;  Laterality: N/A;  . Polypectomy N/A 10/25/2013    Procedure: POLYPECTOMY (endometrial);  Surgeon: Florian Buff, MD;  Location: [facility];  Service: Gynecology;  Laterality: N/A;    OB History   Grav Para Term Preterm Abortions TAB SAB Ect Mult Living   0               No Known Allergies  History   Social History  . Marital Status: Married    Spouse Name: N/A    Number of Children: N/A  . Years of Education: N/A   Social History Main Topics  . Smoking status: Former Smoker -- 5 years  . Smokeless tobacco: Never Used     Comment: hx of "social smoker"   . Alcohol Use: No  . Drug Use: No  . Sexual Activity: Yes    Birth Control/ Protection: None   Other Topics Concern  . None   Social History Narrative  . None    Family History  Problem Relation Age of Onset  . Cancer Mother     Breast Cancer  . Hypertension Father   . Diabetes Father   . Cancer Sister     breast  . Cancer Maternal Grandmother     breast  . Hypertension Sister

## 2013-11-02 ENCOUNTER — Encounter (HOSPITAL_COMMUNITY): Payer: Self-pay

## 2013-11-09 ENCOUNTER — Other Ambulatory Visit: Payer: Self-pay | Admitting: Obstetrics & Gynecology

## 2013-11-09 DIAGNOSIS — C541 Malignant neoplasm of endometrium: Secondary | ICD-10-CM

## 2013-11-14 ENCOUNTER — Ambulatory Visit (HOSPITAL_COMMUNITY)
Admission: RE | Admit: 2013-11-14 | Discharge: 2013-11-14 | Disposition: A | Discharge status: Home / Self Care | Payer: BC Managed Care – PPO | Source: Ambulatory Visit | Attending: Obstetrics & Gynecology | Admitting: Obstetrics & Gynecology

## 2013-11-14 DIAGNOSIS — C549 Malignant neoplasm of corpus uteri, unspecified: Secondary | ICD-10-CM | POA: Insufficient documentation

## 2013-11-14 DIAGNOSIS — C541 Malignant neoplasm of endometrium: Secondary | ICD-10-CM

## 2013-11-14 DIAGNOSIS — Z9889 Other specified postprocedural states: Secondary | ICD-10-CM | POA: Insufficient documentation

## 2013-11-14 DIAGNOSIS — C772 Secondary and unspecified malignant neoplasm of intra-abdominal lymph nodes: Secondary | ICD-10-CM | POA: Insufficient documentation

## 2013-11-14 MED ORDER — GADOBENATE DIMEGLUMINE 529 MG/ML IV SOLN
20.0000 mL | Freq: Once | INTRAVENOUS | Status: AC | PRN
  Administered 2013-11-14: 20 mL via INTRAVENOUS

## 2013-11-21 ENCOUNTER — Telehealth: Payer: Self-pay | Admitting: Obstetrics & Gynecology

## 2013-11-21 NOTE — Telephone Encounter (Signed)
I called the patient and have set up appt for her with the Gyn oncologist in [place].  They are to vcall her Gave results on MRI  Al questions were answertred

## 2013-11-23 ENCOUNTER — Telehealth: Payer: Self-pay | Admitting: *Deleted

## 2013-11-23 NOTE — Telephone Encounter (Signed)
Pt states would like a referral to [facility] due to her history and family history of cancer. Requesting records to be faxed to [phone] with referral. Pt states does have an appt this Friday with GYN Oncologist in [place].

## 2013-11-24 ENCOUNTER — Ambulatory Visit: Payer: BC Managed Care – PPO | Attending: Gynecology | Admitting: Gynecology

## 2013-11-24 ENCOUNTER — Encounter: Payer: Self-pay | Admitting: Gynecology

## 2013-11-24 VITALS — BP 139/86 | HR 70 | Temp 97.9°F | Resp 18 | Ht 64.0 in | Wt 321.0 lb

## 2013-11-24 DIAGNOSIS — I10 Essential (primary) hypertension: Secondary | ICD-10-CM | POA: Insufficient documentation

## 2013-11-24 DIAGNOSIS — Z79899 Other long term (current) drug therapy: Secondary | ICD-10-CM | POA: Insufficient documentation

## 2013-11-24 DIAGNOSIS — E039 Hypothyroidism, unspecified: Secondary | ICD-10-CM | POA: Insufficient documentation

## 2013-11-24 DIAGNOSIS — C549 Malignant neoplasm of corpus uteri, unspecified: Secondary | ICD-10-CM | POA: Insufficient documentation

## 2013-11-24 DIAGNOSIS — Z87891 Personal history of nicotine dependence: Secondary | ICD-10-CM | POA: Insufficient documentation

## 2013-11-24 DIAGNOSIS — C801 Malignant (primary) neoplasm, unspecified: Secondary | ICD-10-CM

## 2013-11-24 DIAGNOSIS — C541 Malignant neoplasm of endometrium: Secondary | ICD-10-CM

## 2013-11-24 NOTE — Progress Notes (Signed)
Consult Note: Gyn-Onc   Casey King 33 y.o. female  Chief Complaint  Patient presents with  . Adenocarcinoma    New Consult     Assessment : Grade 1 endometrial carcinoma. Morbid obesity.  Plan: We discussed treatment options including hysterectomy versus progestin therapy. The patient is strongly desirous of preserving fertility and she understands the risks of using hormonal therapy. Pros and cons of Megace versus a Mirena IUD were discussed. The patient is previously used Megace and had a considerable amount of weight gain. She worked for her to have a Mirena IUD placed.  We'll ask her to have a Mirena placed by Dr. Elonda Husky.. We would like to have a repeat endometrial biopsy in approximately 3 months. If she has resolution and then I would leave the IUD and she chooses to become pregnant. If on the other hand at 3 months she still has persistent grade 1 endometrial cancer I would continue the IUD for 3 more months and then repeated biopsy. I would be happy to see the patient back for further discussion if need be.   HPI: 33 year old white female seen in consultation at the request of Dr. Elonda Husky regarding management of a newly diagnosed endometrial carcinoma. Over the past several years the patient's had episodes of very heavy vaginal bleeding necessitating blood transfusions on 2 occasions.  Endometrial biopsy previously showed polyps. The patient underwent hysteroscopy and D&C which revealed a grade 1 endometrial carcinoma.  Review of Systems:10 point review of systems is negative except as noted in interval history.   Vitals: Blood pressure 139/86, pulse 70, temperature 97.9 F (36.6 C), temperature source Oral, resp. rate 18, height 5\' 4"  (1.626 m), weight 321 lb (145.605 kg), last menstrual period 10/17/2013, SpO2 100.00%.  Physical Exam: General : The patient is a morbidly obese healthy woman in no acute distress.  HEENT: normocephalic, extraoccular movements normal; neck is  supple without thyromegally  Lynphnodes: Supraclavicular and inguinal nodes not enlarged  Abdomen: Obese, Soft, non-tender, no ascites, no organomegally, no masses, no hernias  Pelvic:  EGBUS: Normal female  Vagina: Normal, no lesions  Urethra and Bladder: Normal, non-tender  Cervix: Normal Uterus: Impossible to assess secondary to the patient's habitus Bi-manual examination: Non-tender; no adenxal masses or nodularity  Rectal: normal sphincter tone, no masses, no blood  Lower extremities: No edema or varicosities. Normal range of motion      No Known Allergies  Past Medical History  Diagnosis Date  . Morbid obesity   . Hypertension   . Anemia   . Palpitations   . History of hypothyroidism   . Chest pain   . Fatigue   . History of anemia 08/29/2013  . Dysmenorrhea 10/02/2013  . Endometrial polyp 10/06/2013  . SOB (shortness of breath)     pt states "i think it comes from my anemia"    Past Surgical History  Procedure Laterality Date  . Endometrial biopsy    . Dilation and curettage of uterus    . Hysteroscopy w/d&c N/A 10/25/2013    Procedure: DILATATION AND CURETTAGE /HYSTEROSCOPY;  Surgeon: Florian Buff, MD;  Location: [facility];  Service: Gynecology;  Laterality: N/A;  . Polypectomy N/A 10/25/2013    Procedure: POLYPECTOMY (endometrial);  Surgeon: Florian Buff, MD;  Location: [facility];  Service: Gynecology;  Laterality: N/A;    Current Outpatient Prescriptions  Medication Sig Dispense Refill  . ferrous sulfate 325 (65 FE) MG tablet Take 325 mg by mouth daily with breakfast.      .  HYDROcodone-acetaminophen (NORCO/VICODIN) 5-325 MG per tablet Take 1 tablet by mouth every 6 (six) hours as needed.  30 tablet  0  . ketorolac (TORADOL) 10 MG tablet Take 1 tablet (10 mg total) by mouth every 8 (eight) hours as needed.  15 tablet  0  . KRILL OIL PO Take 1 capsule by mouth daily.      . metoprolol (LOPRESSOR) 50 MG tablet       . metoprolol succinate (TOPROL-XL) 50 MG 24 hr tablet  Take 25 mg by mouth every 12 (twelve) hours. Take with or immediately following a meal.      . montelukast (SINGULAIR) 10 MG tablet Take 10 mg by mouth at bedtime.      . ondansetron (ZOFRAN) 8 MG tablet Take 1 tablet (8 mg total) by mouth every 8 (eight) hours as needed for nausea.  24 tablet  1   No current facility-administered medications for this visit.    History   Social History  . Marital Status: Married    Spouse Name: N/A    Number of Children: N/A  . Years of Education: N/A   Occupational History  . Not on file.   Social History Main Topics  . Smoking status: Former Smoker -- 5 years  . Smokeless tobacco: Never Used     Comment: hx of "social smoker"   . Alcohol Use: No  . Drug Use: No  . Sexual Activity: Yes    Birth Control/ Protection: None   Other Topics Concern  . Not on file   Social History Narrative  . No narrative on file    Family History  Problem Relation Age of Onset  . Cancer Mother     Breast Cancer  . Hypertension Father   . Diabetes Father   . Cancer Sister     breast  . Cancer Maternal Grandmother     breast  . Hypertension Sister       Alvino Chapel, MD 11/24/2013, 2:49 PM

## 2013-11-24 NOTE — Patient Instructions (Signed)
Please contact Dr. Brynda Greathouse office to have a Mirena IUD placed. Dr. Elonda Husky should repeat an endometrial biopsy in 3 months.

## 2013-11-24 NOTE — Telephone Encounter (Signed)
Called pt and left a message regarding gyn oncology follow up

## 2013-11-29 ENCOUNTER — Telehealth: Payer: Self-pay | Admitting: Obstetrics & Gynecology

## 2013-12-01 ENCOUNTER — Ambulatory Visit: Payer: BC Managed Care – PPO | Admitting: Gynecology

## 2013-12-07 ENCOUNTER — Telehealth: Payer: Self-pay | Admitting: Obstetrics & Gynecology

## 2013-12-11 NOTE — Telephone Encounter (Signed)
Chrystal has spoken with the patient and the message has been forwarded to Dr. Elonda Husky

## 2013-12-12 ENCOUNTER — Telehealth: Payer: Self-pay | Admitting: Obstetrics & Gynecology

## 2013-12-12 NOTE — Telephone Encounter (Signed)
Pt informed message routed to Dr. Elonda Husky on 12/07/2013 of pt request for referral to [place]. Pt aware Dr. Elonda Husky out of office until next Monday.

## 2013-12-18 NOTE — Telephone Encounter (Signed)
Message sent to Dr. Elonda Husky

## 2013-12-27 ENCOUNTER — Ambulatory Visit (INDEPENDENT_AMBULATORY_CARE_PROVIDER_SITE_OTHER): Payer: BC Managed Care – PPO | Admitting: Physician Assistant

## 2013-12-27 VITALS — BP 138/82 | HR 68 | Temp 98.5°F | Resp 17 | Ht 65.0 in | Wt 324.0 lb

## 2013-12-27 DIAGNOSIS — L03319 Cellulitis of trunk, unspecified: Secondary | ICD-10-CM

## 2013-12-27 DIAGNOSIS — L02219 Cutaneous abscess of trunk, unspecified: Secondary | ICD-10-CM

## 2013-12-27 DIAGNOSIS — N926 Irregular menstruation, unspecified: Secondary | ICD-10-CM

## 2013-12-27 DIAGNOSIS — L02214 Cutaneous abscess of groin: Secondary | ICD-10-CM

## 2013-12-27 LAB — POCT URINE PREGNANCY: Preg Test, Ur: NEGATIVE

## 2013-12-27 MED ORDER — DOXYCYCLINE HYCLATE 100 MG PO CAPS: 100.0000 mg | ORAL_CAPSULE | Freq: Two times a day (BID) | ORAL | Status: DC

## 2013-12-27 NOTE — Progress Notes (Signed)
   Subjective:    Patient ID: Casey King, female    DOB: 03-12-1981, 33 y.o.   MRN: [id number]  HPI 33 year old female presents for evaluation of a possible boil in her groin area. First noticed 3 days ago. Admits she tried to squeeze it and was not able to express any pus.  She used hot compresses which seemed to help at first but now it seems bigger.   Denies fever, chills, nausea, vomiting, or headache.  No hx of MRSA She was recently diagnosed with endometrial cancer, stage 1. Doing well with this, but is discouraged because she would like to become pregnant.    Review of Systems  Constitutional: Negative for fever and chills.  Gastrointestinal: Negative for nausea and vomiting.  Skin: Positive for color change.  Neurological: Negative for headaches.       Objective:   Physical Exam  Constitutional: She is oriented to person, place, and time. She appears well-developed and well-nourished.  HENT:  Head: Normocephalic and atraumatic.  Right Ear: External ear normal.  Left Ear: External ear normal.  Eyes: Conjunctivae are normal.  Neck: Normal range of motion.  Cardiovascular: Normal rate.   Pulmonary/Chest: Effort normal.  Neurological: She is alert and oriented to person, place, and time.  Skin:     Psychiatric: She has a normal mood and affect. Her behavior is normal. Judgment and thought content normal.     Results for orders placed in visit on 12/27/13  POCT URINE PREGNANCY      Result Value Ref Range   Preg Test, Ur Negative           Assessment & Plan:  Abscess, groin - Plan: doxycycline (VIBRAMYCIN) 100 MG capsule  Irregular menstrual cycle - Plan: POCT urine pregnancy  Will treat for early abscess/cellulitis with doxycycline 100 mg bid x 10 days Continue warm compresses 2-3 times daily  RTC precautions discussed. Follow up if symptoms worsening or fail to improve.

## 2013-12-28 ENCOUNTER — Encounter: Payer: Self-pay | Admitting: Physician Assistant

## 2014-02-10 ENCOUNTER — Ambulatory Visit (INDEPENDENT_AMBULATORY_CARE_PROVIDER_SITE_OTHER): Payer: BC Managed Care – PPO | Admitting: Emergency Medicine

## 2014-02-10 VITALS — BP 136/82 | HR 67 | Temp 98.4°F | Resp 14 | Ht 64.5 in | Wt 327.6 lb

## 2014-02-10 DIAGNOSIS — L255 Unspecified contact dermatitis due to plants, except food: Secondary | ICD-10-CM

## 2014-02-10 DIAGNOSIS — L237 Allergic contact dermatitis due to plants, except food: Secondary | ICD-10-CM

## 2014-02-10 NOTE — Patient Instructions (Signed)

## 2014-02-10 NOTE — Progress Notes (Signed)
Subjective:   This chart was scribed for Casey King A. Casey Farrier, MD by Forrestine Him, [facility] Scribe. This patient was seen in room 1 and the patient's care was started 11:23 AM.     Patient ID: Casey King, female    DOB: Apr 29, 1981, 33 y.o.   MRN: [id number]  Chief Complaint  Patient presents with   Rash    posion oak since Monday     HPI  HPI Comments: Casey King is a 33 y.o. female with a PMHx of endometrial cancer and HTN who presents to [facility] complaining of possible poison oak exposure to the face and abdomen onset 6 days. She reports new onset swelling to her face noted this morning after waking from sleep. Pt states she was seen at [facility] earlier this week for symptoms. During visit pt was given a Dexamethasone injection which she states made her very ill. She was also given a Prednisone taper ar discharge to help manage symptoms. At this time she denies any fever or chills. No known allergies to medications. No other concerns this visit.   Patient Active Problem List   Diagnosis Date Noted   Endometrial cancer 11/24/2013   Endometrial polyp 10/06/2013   Dysmenorrhea 10/02/2013   History of anemia 08/29/2013   DUB (dysfunctional uterine bleeding) 08/03/2013   Morbid obesity 08/03/2013   Menorrhagia 07/11/2013   Fatigue    Past Medical History  Diagnosis Date   Morbid obesity    Hypertension    Anemia    Palpitations    History of hypothyroidism    Chest pain    Fatigue    History of anemia 08/29/2013   Dysmenorrhea 10/02/2013   Endometrial polyp 10/06/2013   SOB (shortness of breath)     pt states "i think it comes from my anemia"   Blood transfusion without reported diagnosis    Cancer    Uterine cancer    Past Surgical History  Procedure Laterality Date   Endometrial biopsy     Dilation and curettage of uterus     Hysteroscopy w/d&c N/A 10/25/2013    Procedure: DILATATION  AND CURETTAGE /HYSTEROSCOPY;  Surgeon: Florian Buff, MD;  Location: [facility];  Service: Gynecology;  Laterality: N/A;   Polypectomy N/A 10/25/2013    Procedure: POLYPECTOMY (endometrial);  Surgeon: Florian Buff, MD;  Location: [facility];  Service: Gynecology;  Laterality: N/A;   No Known Allergies Prior to Admission medications   Medication Sig Start Date End Date Taking? Authorizing Provider  KRILL OIL PO Take 1 capsule by mouth daily.   Yes Historical Provider, MD  megestrol (MEGACE) 40 MG tablet Take 40 mg by mouth daily.   Yes Historical Provider, MD  metoprolol (LOPRESSOR) 50 MG tablet  10/12/13  Yes Historical Provider, MD  doxycycline (VIBRAMYCIN) 100 MG capsule Take 1 capsule (100 mg total) by mouth 2 (two) times daily. 12/27/13   Collene Leyden, PA-C    Review of Systems  Constitutional: Negative for fever and chills.  HENT: Positive for facial swelling.   Skin: Positive for color change and rash (Facial).    Triage Vitals: BP 136/82   Pulse 67   Temp(Src) 98.4 F (36.9 C) (Oral)   Resp 14   Ht 5' 4.5" (1.638 m)   Wt 327 lb 9.6 oz (148.598 kg)   BMI 55.38 kg/m2   SpO2 100%   Objective:  Physical Exam  Nursing note and vitals reviewed.  Constitutional: She is oriented to person, place, and time. She appears well-developed and well-nourished.  HENT:  Head: Normocephalic.  Eyes: EOM are normal.  Neck: Normal range of motion.  Pulmonary/Chest: Effort normal.  Abdominal: She exhibits no distension.  Musculoskeletal: Normal range of motion.  Neurological: She is alert and oriented to person, place, and time.  Skin:  Contact dermatitis  involving face. More so to nose and R cheek with areas of contact dermatitis to both hands  Psychiatric: She has a normal mood and affect.     Assessment & Plan:  She has a Medrol Dosepak in her possession which she can take. I personally performed the services described in this documentation, which was scribed in my presence. The recorded  information has been reviewed and is accurate.

## 2014-05-05 ENCOUNTER — Ambulatory Visit (INDEPENDENT_AMBULATORY_CARE_PROVIDER_SITE_OTHER): Payer: BC Managed Care – PPO | Admitting: Physician Assistant

## 2014-05-05 VITALS — BP 124/88 | HR 84 | Temp 99.1°F | Resp 18 | Ht 64.5 in | Wt 340.2 lb

## 2014-05-05 DIAGNOSIS — J069 Acute upper respiratory infection, unspecified: Secondary | ICD-10-CM

## 2014-05-05 MED ORDER — IPRATROPIUM BROMIDE 0.03 % NA SOLN: 2.0000 | Freq: Two times a day (BID) | NASAL | Status: AC

## 2014-05-05 NOTE — Progress Notes (Signed)
Subjective:    Patient ID: Casey King, female    DOB: 1981/01/09, 33 y.o.   MRN: [id number] Patient Active Problem List   Diagnosis Date Noted  . Endometrial cancer 11/24/2013  . Endometrial polyp 10/06/2013  . Dysmenorrhea 10/02/2013  . History of anemia 08/29/2013  . DUB (dysfunctional uterine bleeding) 08/03/2013  . Morbid obesity 08/03/2013  . Menorrhagia 07/11/2013  . Fatigue    Prior to Admission medications   Medication Sig Start Date End Date Taking? Authorizing Provider  KRILL OIL PO Take 1 capsule by mouth daily.   Yes Historical Provider, MD  megestrol (MEGACE) 40 MG tablet Take 40 mg by mouth daily.   Yes Historical Provider, MD  metoprolol (LOPRESSOR) 50 MG tablet  10/12/13  Yes Historical Provider, MD  doxycycline (VIBRAMYCIN) 100 MG capsule Take 1 capsule (100 mg total) by mouth 2 (two) times daily. 12/27/13   Collene Leyden, PA-C          No Known Allergies  HPI   This is a 33 year old female with PMH allergic rhinitis and morbid obesity presenting with 3 days of fever, nasal congestion, left ear pain and post-nasal drip. She did not take her temperature but reports she felt hot. She is endorsing yellow nasal discharge. She takes claritin and flonase daily for allergies but has not taken anything new. She denies sore throat, cough, or GI symptoms. She is not a smoker and does not have asthma.  Review of Systems  Constitutional: Positive for fever.  HENT: Positive for congestion, ear pain and postnasal drip. Negative for sinus pressure and sore throat.   Eyes: Negative.   Respiratory: Negative for cough.   Gastrointestinal: Negative.   Skin: Negative.   Allergic/Immunologic: Positive for environmental allergies.      Objective:   Physical Exam  Constitutional: She is oriented to person, place, and time. She appears well-developed and well-nourished. No distress.  HENT:  Head: Normocephalic and atraumatic.  Right Ear: Hearing, tympanic membrane, external  ear and ear canal normal.  Left Ear: Hearing, tympanic membrane, external ear and ear canal normal.  Nose: Mucosal edema present.  Mouth/Throat: Uvula is midline and mucous membranes are normal. Posterior oropharyngeal erythema present. No oropharyngeal exudate.  Eyes: Conjunctivae and lids are normal. Right eye exhibits no discharge. Left eye exhibits no discharge. No scleral icterus.  Cardiovascular: Normal rate, regular rhythm, normal heart sounds and normal pulses.   No murmur heard. Pulmonary/Chest: Effort normal and breath sounds normal. No respiratory distress. She has no wheezes. She has no rhonchi. She has no rales.  Lymphadenopathy:       Head (right side): No submental, no submandibular, no tonsillar and no occipital adenopathy present.       Head (left side): No submental, no submandibular, no tonsillar and no occipital adenopathy present.    She has cervical adenopathy.       Right cervical: Deep cervical adenopathy present. No superficial cervical and no posterior cervical adenopathy present.      Left cervical: No superficial cervical, no deep cervical and no posterior cervical adenopathy present.  Neurological: She is alert and oriented to person, place, and time.  Skin: Skin is warm, dry and intact. No lesion and no rash noted.  Psychiatric: She has a normal mood and affect. Her speech is normal and behavior is normal. Thought content normal.      Assessment & Plan:  1. Viral URI This is likely a viral URI. She has mucinex at  home that she will take in addition to atrovent nasal spray. If she does not improve in 7 days, she will return.  - ipratropium (ATROVENT) 0.03 % nasal spray; Place 2 sprays into both nostrils 2 (two) times daily.  Dispense: 30 mL; Refill: 0   Nicole V. Drenda Freeze, MHS [facility]  05/05/2014

## 2014-05-05 NOTE — Patient Instructions (Signed)
Drink plenty of water and get plenty of rest. Take mucinex.  Use atrovent nasal spray on top of flonase.  Take ibuprofen/tylenol for pain/fever If not better in 7 days, return to clinic.

## 2014-05-05 NOTE — Progress Notes (Signed)
I was directly involved with the patient's care and agree with the physical, diagnosis and treatment plan.  

## 2014-10-18 ENCOUNTER — Ambulatory Visit (INDEPENDENT_AMBULATORY_CARE_PROVIDER_SITE_OTHER): Payer: BLUE CROSS/BLUE SHIELD | Admitting: Physician Assistant

## 2014-10-18 VITALS — BP 126/72 | HR 76 | Temp 98.0°F | Resp 18 | Ht 65.0 in | Wt 365.0 lb

## 2014-10-18 DIAGNOSIS — S29012A Strain of muscle and tendon of back wall of thorax, initial encounter: Secondary | ICD-10-CM | POA: Diagnosis not present

## 2014-10-18 DIAGNOSIS — M546 Pain in thoracic spine: Secondary | ICD-10-CM | POA: Diagnosis not present

## 2014-10-18 MED ORDER — MELOXICAM 15 MG PO TABS: 15.0000 mg | ORAL_TABLET | Freq: Every day | ORAL | Status: DC

## 2014-10-18 MED ORDER — TRAMADOL HCL 50 MG PO TABS: 50.0000 mg | ORAL_TABLET | Freq: Three times a day (TID) | ORAL | Status: DC | PRN

## 2014-10-18 NOTE — Progress Notes (Signed)
  Medical screening examination/treatment/procedure(s) were performed by non-physician practitioner and as supervising physician I was immediately available for consultation/collaboration.     

## 2014-10-18 NOTE — Patient Instructions (Signed)

## 2014-10-18 NOTE — Progress Notes (Signed)
Subjective:    Patient ID: Casey King, female    DOB: Nov 26, 1980, 34 y.o.   MRN: [id number]  HPI Patient presents for right-sided mid-back pain that started this morning. Was doing yard work yesterday evening and carrying/laying large bags of mulch. Was bending a lot to lay mulch. Last night did not have any pain, but first noticed 9/10 achy pain while sitting at kitchen table. Pain has improved to 7/10 pain and feels sore. Pain does not radiate. Denies loss of ROM/function/sensation and no numbness/weakness/tingling of extremities. No back surgeries or trauma. Has tried to do some yoga poses and had some relief, but no medications taken. No urinary/bowel sx. NKDA.    Review of Systems  Constitutional: Negative.   Respiratory: Negative for shortness of breath.   Cardiovascular: Negative for chest pain, palpitations and leg swelling.  Gastrointestinal: Negative for nausea, vomiting, diarrhea and constipation.  Genitourinary: Negative for dysuria, frequency, hematuria, vaginal discharge and difficulty urinating.  Musculoskeletal: Positive for myalgias and back pain. Negative for joint swelling, arthralgias, gait problem, neck pain and neck stiffness.  Neurological: Negative for weakness and numbness.       Objective:   Physical Exam  Constitutional: She is oriented to person, place, and time. She appears well-developed and well-nourished. No distress.  Blood pressure 126/72, pulse 76, temperature 98 F (36.7 C), temperature source Oral, resp. rate 18, height 5\' 5"  (1.651 m), weight 365 lb (165.563 kg), SpO2 97 %.  HENT:  Head: Normocephalic and atraumatic.  Right Ear: External ear normal.  Left Ear: External ear normal.  Mouth/Throat: Oropharynx is clear and moist. No oropharyngeal exudate.  Eyes: Conjunctivae and EOM are normal. Pupils are equal, round, and reactive to light. Right eye exhibits no discharge. Left eye exhibits no discharge. No scleral icterus.  Neck: Normal range  of motion. Neck supple.  Cardiovascular: Normal rate, regular rhythm and normal heart sounds.  Exam reveals no gallop and no friction rub.   No murmur heard. Pulmonary/Chest: Effort normal and breath sounds normal. No respiratory distress. She has no wheezes. She has no rales. She exhibits no tenderness.  Abdominal: Soft. Bowel sounds are normal. There is no CVA tenderness.  Limited to body habitus  Musculoskeletal: Normal range of motion. She exhibits no edema or tenderness.       Cervical back: Normal.       Thoracic back: She exhibits pain. She exhibits normal range of motion, no tenderness, no bony tenderness, no swelling, no edema, no deformity, no laceration and no spasm.       Lumbar back: Normal.       Arms: Lymphadenopathy:    She has no cervical adenopathy.  Neurological: She is alert and oriented to person, place, and time. She has normal strength and normal reflexes. She displays no atrophy. No cranial nerve deficit or sensory deficit. She exhibits normal muscle tone. Coordination and gait normal.  Skin: Skin is warm and dry. No rash noted. She is not diaphoretic. No erythema. No pallor.       Assessment & Plan:  1. Muscle strain of right upper back, initial encounter 2. Right-sided thoracic back pain Do back exercises.  - meloxicam (MOBIC) 15 MG tablet; Take 1 tablet (15 mg total) by mouth daily.  Dispense: 30 tablet; Refill: 1 - traMADol (ULTRAM) 50 MG tablet; Take 1 tablet (50 mg total) by mouth every 8 (eight) hours as needed.  Dispense: 30 tablet; Refill: 0   Tiffini Blacksher PA-C  [facility] 10/18/2014 4:50 PM

## 2015-03-08 ENCOUNTER — Ambulatory Visit (INDEPENDENT_AMBULATORY_CARE_PROVIDER_SITE_OTHER): Payer: BLUE CROSS/BLUE SHIELD | Admitting: Family Medicine

## 2015-03-08 VITALS — BP 132/82 | HR 63 | Temp 98.1°F | Resp 18 | Ht 65.0 in | Wt 366.0 lb

## 2015-03-08 DIAGNOSIS — R062 Wheezing: Secondary | ICD-10-CM

## 2015-03-08 DIAGNOSIS — J0111 Acute recurrent frontal sinusitis: Secondary | ICD-10-CM | POA: Diagnosis not present

## 2015-03-08 DIAGNOSIS — J208 Acute bronchitis due to other specified organisms: Secondary | ICD-10-CM

## 2015-03-08 MED ORDER — CEFDINIR 300 MG PO CAPS: 300.0000 mg | ORAL_CAPSULE | Freq: Two times a day (BID) | ORAL | Status: AC

## 2015-03-08 MED ORDER — ALBUTEROL SULFATE HFA 108 (90 BASE) MCG/ACT IN AERS: 2.0000 | INHALATION_SPRAY | Freq: Four times a day (QID) | RESPIRATORY_TRACT | Status: AC | PRN

## 2015-03-08 NOTE — Patient Instructions (Signed)
Use the inhaler as needed for cough and chest tightness Take the omnicef antibiotic as directed Ok to use mucinex as needed Let me know if you do not feel better in the next few days- Sooner if worse.

## 2015-03-08 NOTE — Progress Notes (Signed)
[facility] [address], [place] [address] [phone]  Date:  03/08/2015   Name:  Casey King   DOB:  [birth year]   MRN:  [id number]  PCP:  Florina Ou, MD    Chief Complaint: Nasal Congestion; Chest congestion; Ear Pain; and Headache   History of Present Illness:  Casey King is a 34 y.o. very pleasant female patient who presents with the following:  Here today with illness or allergies.  She has noted chest congestion for a month or so- felt like there was mucus in her throat, and she was coughing up some clear mucus. Over the last couple of days she has gotten worse  She has noted a scratchy throat and nearly gotten laryngitis a couple of times Her ears have hurt intermittently over the last 2 weeks.  She also notes sinus pressure and headaches She has been taking claritin daily.  However notes that this tends to be her worst time of year for allergies leading into illness. No GI illness Her dad has been a little ill with similar sx She has noted wheezing, but does not generally have asthma  She does not have menses- she has an IUD  Patient Active Problem List   Diagnosis Date Noted  . Endometrial cancer 11/24/2013  . Endometrial polyp 10/06/2013  . Dysmenorrhea 10/02/2013  . History of anemia 08/29/2013  . DUB (dysfunctional uterine bleeding) 08/03/2013  . Morbid obesity 08/03/2013  . Menorrhagia 07/11/2013  . Fatigue     Past Medical History  Diagnosis Date  . Morbid obesity   . Hypertension   . Anemia   . Palpitations   . History of hypothyroidism   . Chest pain   . Fatigue   . History of anemia 08/29/2013  . Dysmenorrhea 10/02/2013  . Endometrial polyp 10/06/2013  . SOB (shortness of breath)     pt states "i think it comes from my anemia"  . Blood transfusion without reported diagnosis   . Cancer   . Uterine cancer     Past Surgical History  Procedure Laterality Date  . Endometrial biopsy    . Dilation and curettage of  uterus    . Hysteroscopy w/d&c N/A 10/25/2013    Procedure: DILATATION AND CURETTAGE /HYSTEROSCOPY;  Surgeon: Florian Buff, MD;  Location: [facility];  Service: Gynecology;  Laterality: N/A;  . Polypectomy N/A 10/25/2013    Procedure: POLYPECTOMY (endometrial);  Surgeon: Florian Buff, MD;  Location: [facility];  Service: Gynecology;  Laterality: N/A;    Social History  Substance Use Topics  . Smoking status: Former Smoker -- 5 years  . Smokeless tobacco: Never Used     Comment: hx of "social smoker"   . Alcohol Use: No    Family History  Problem Relation Age of Onset  . Cancer Mother     Breast Cancer  . Hypertension Father   . Diabetes Father   . Cancer Sister     breast  . Cancer Maternal Grandmother     breast  . Hypertension Sister     No Known Allergies  Medication list has been reviewed and updated.  Current Outpatient Prescriptions on File Prior to Visit  Medication Sig Dispense Refill  . KRILL OIL PO Take 1 capsule by mouth daily.    . metoprolol (LOPRESSOR) 50 MG tablet     . ipratropium (ATROVENT) 0.03 % nasal spray Place 2 sprays into both nostrils 2 (two) times daily. (Patient not taking:  Reported on 10/18/2014) 30 mL 0  . megestrol (MEGACE) 40 MG tablet Take 40 mg by mouth daily.    . meloxicam (MOBIC) 15 MG tablet Take 1 tablet (15 mg total) by mouth daily. (Patient not taking: Reported on 03/08/2015) 30 tablet 1  . traMADol (ULTRAM) 50 MG tablet Take 1 tablet (50 mg total) by mouth every 8 (eight) hours as needed. (Patient not taking: Reported on 03/08/2015) 30 tablet 0   No current facility-administered medications on file prior to visit.    Review of Systems:  As per HPI- otherwise negative. Her most recent check up for endometrial cancer was negative    Physical Examination: Filed Vitals:   03/08/15 1256  BP: 132/82  Pulse: 63  Temp: 98.1 F (36.7 C)  Resp: 18   Filed Vitals:   03/08/15 1256  Height: 5\' 5"  (1.651 m)  Weight: 366 lb (166.017 kg)    Body mass index is 60.91 kg/(m^2). Ideal Body Weight: Weight in (lb) to have BMI = 25: 149.9  GEN: WDWN, NAD, Non-toxic, A & O x 3, obese, looks well HEENT: Atraumatic, Normocephalic. Neck supple. No masses, No LAD.  Bilateral TM wnl, oropharynx normal.  PEERL,EOMI.   Frontal sinus TTP Ears and Nose: No external deformity. CV: RRR, No M/G/R. No JVD. No thrill. No extra heart sounds. PULM: CTA B, no crackles, rhonchi. No retractions. No resp. distress. No accessory muscle use. Minimal wheezing on left EXTR: No c/c/e NEURO Normal gait.  PSYCH: Normally interactive. Conversant. Not depressed or anxious appearing.  Calm demeanor.    Assessment and Plan: Acute bronchitis due to other specified organisms - Plan: cefdinir (OMNICEF) 300 MG capsule  Acute recurrent frontal sinusitis - Plan: cefdinir (OMNICEF) 300 MG capsule  Wheezing - Plan: albuterol (PROVENTIL HFA;VENTOLIN HFA) 108 (90 BASE) MCG/ACT inhaler  Treat for a month of sx with omnicef and albuterol as needed   Signed Lamar Blinks, MD

## 2017-12-22 ENCOUNTER — Other Ambulatory Visit: Payer: Self-pay | Admitting: Gastroenterology

## 2017-12-22 DIAGNOSIS — R1033 Periumbilical pain: Secondary | ICD-10-CM

## 2017-12-23 ENCOUNTER — Ambulatory Visit (HOSPITAL_COMMUNITY)
Admission: RE | Admit: 2017-12-23 | Discharge: 2017-12-23 | Disposition: A | Discharge status: Home / Self Care | Payer: BLUE CROSS/BLUE SHIELD | Source: Ambulatory Visit | Attending: Gastroenterology | Admitting: Gastroenterology

## 2017-12-23 DIAGNOSIS — R1033 Periumbilical pain: Secondary | ICD-10-CM | POA: Insufficient documentation

## 2017-12-23 DIAGNOSIS — K5732 Diverticulitis of large intestine without perforation or abscess without bleeding: Secondary | ICD-10-CM | POA: Insufficient documentation

## 2017-12-23 DIAGNOSIS — N83202 Unspecified ovarian cyst, left side: Secondary | ICD-10-CM | POA: Insufficient documentation

## 2017-12-23 DIAGNOSIS — Z975 Presence of (intrauterine) contraceptive device: Secondary | ICD-10-CM | POA: Insufficient documentation

## 2017-12-23 LAB — POCT I-STAT CREATININE: CREATININE: 0.7 mg/dL (ref 0.44–1.00)

## 2017-12-23 MED ORDER — IOPAMIDOL (ISOVUE-300) INJECTION 61%
100.0000 mL | Freq: Once | INTRAVENOUS | Status: AC | PRN
  Administered 2017-12-23: 100 mL via INTRAVENOUS

## 2019-06-11 IMAGING — CT CT ABD-PELV W/ CM
2 of 7 series · 14 of 46 positions shown, 18 images · IV contrast (Isovue)
Comparison: None.

CLINICAL DATA: Low abdominal pain and cramping for 2 days.

EXAM:
CT ABDOMEN AND PELVIS WITH CONTRAST
TECHNIQUE: Multidetector CT imaging of the abdomen and pelvis was performed
using the standard protocol following bolus administration of
intravenous contrast.
CONTRAST:  100mL [id number] IOPAMIDOL ([id number]) INJECTION 61%

[Series 2: axial st · axial · 0.79mm/px · z∈[+1049,+1479]mm · 11 of 103 slices shown, 15 images]
[im 11/103  soft-tissue]
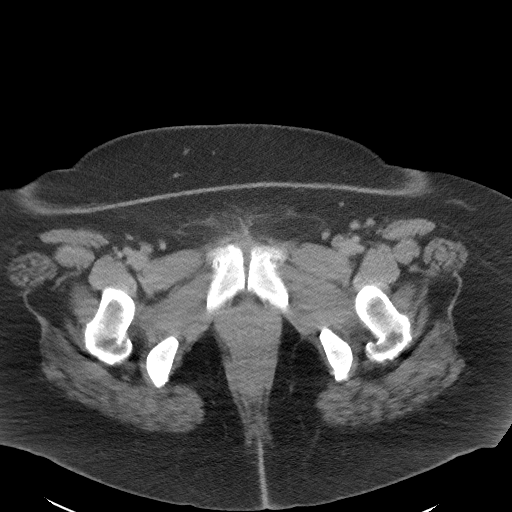
[im 11/103  bone]
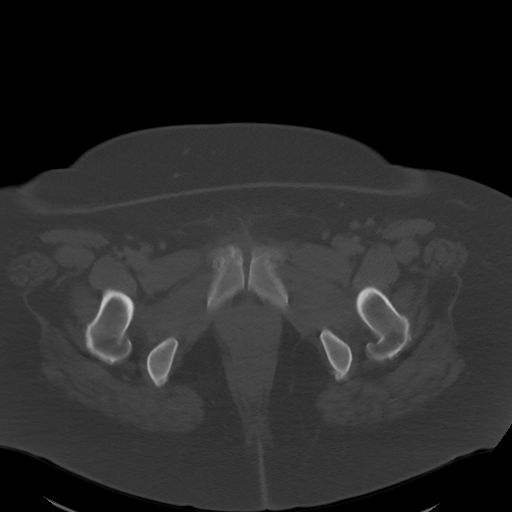
[im 22/103  soft-tissue]
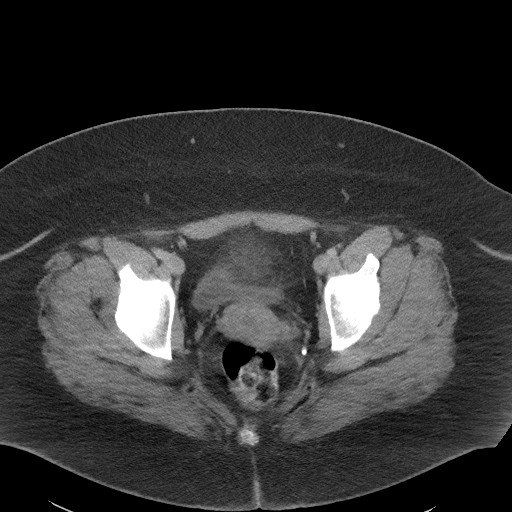
[im 33/103  soft-tissue]
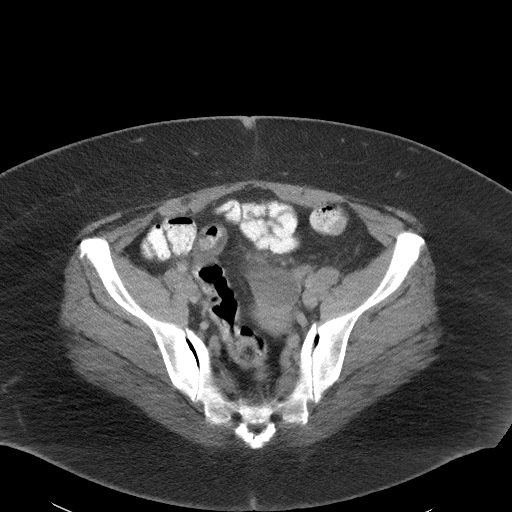
[im 43/103  soft-tissue]
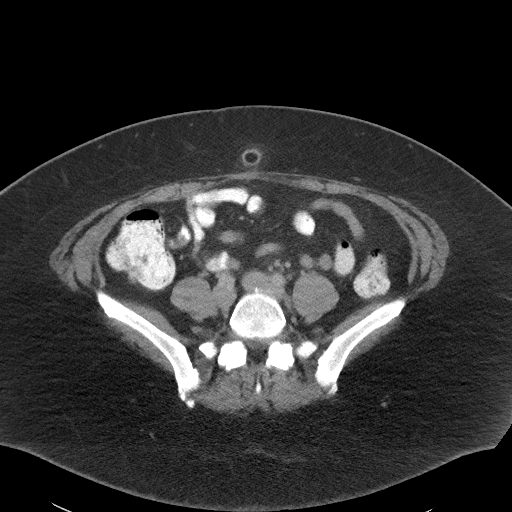
[im 54/103  soft-tissue]
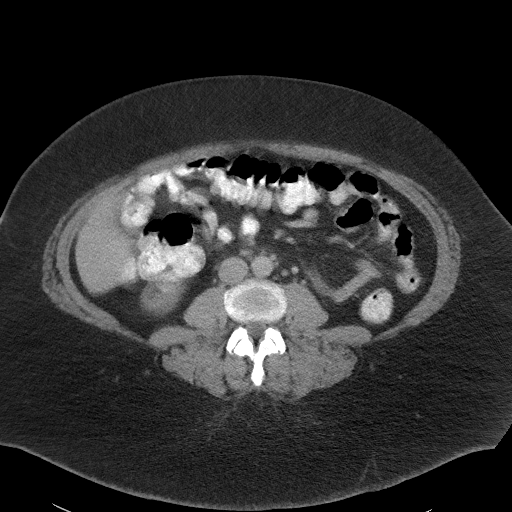
[im 65/103  soft-tissue]
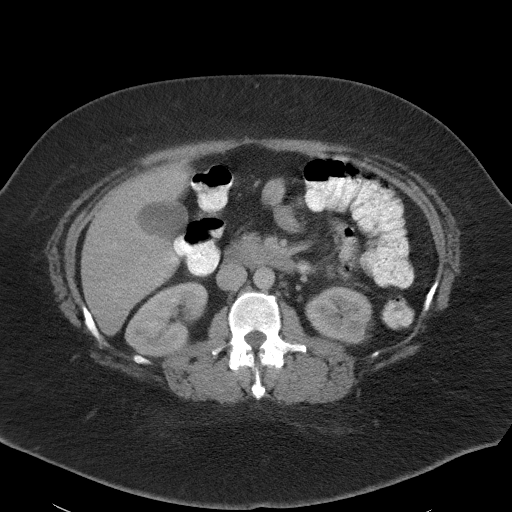
[im 76/103  soft-tissue]
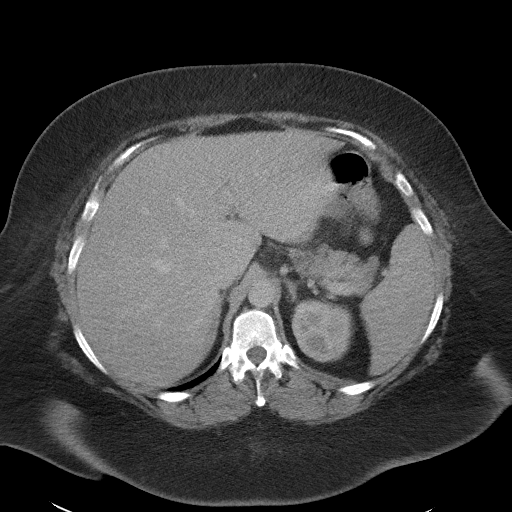
[im 81/103  lung]
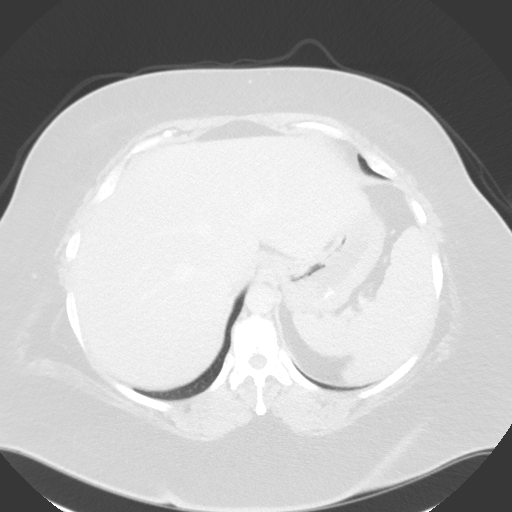
[im 86/103  soft-tissue]
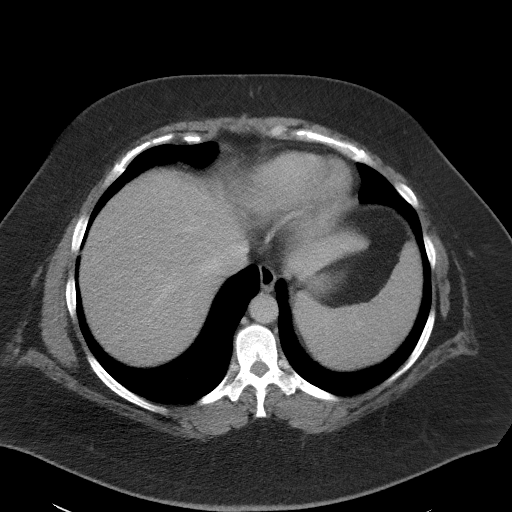
[im 86/103  lung]
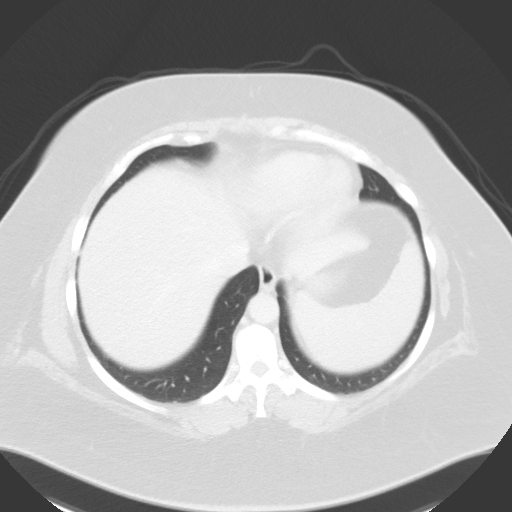
[im 92/103  lung]
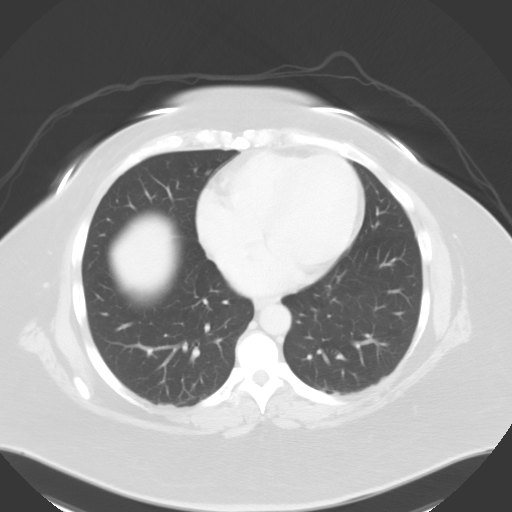
[im 97/103  soft-tissue]
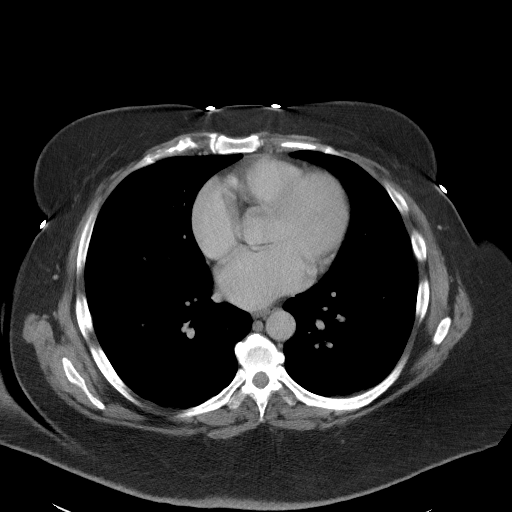
[im 97/103  lung]
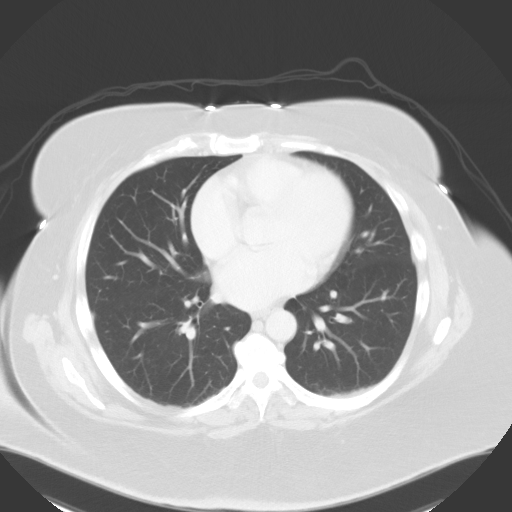
[im 97/103  bone]
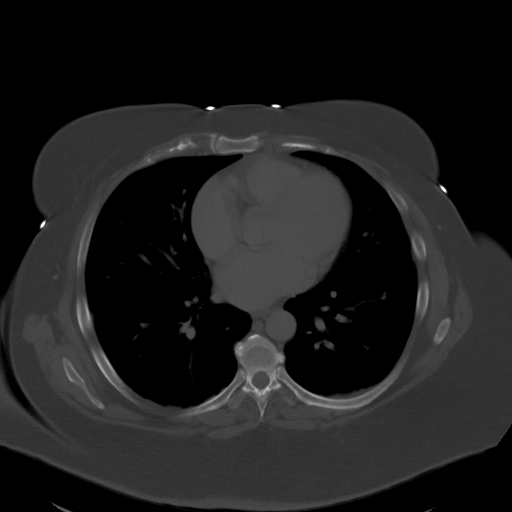

[Series 6: coronal st · coronal · 0.89mm/px · 3 of 109 slices shown]
[im 28/109  soft-tissue]
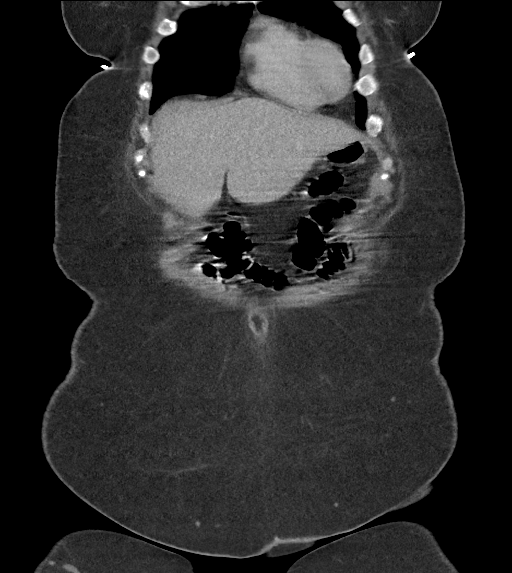
[im 55/109  soft-tissue]
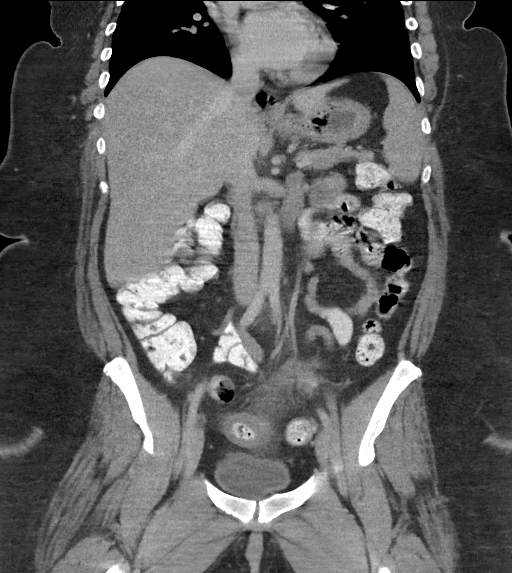
[im 82/109  soft-tissue]
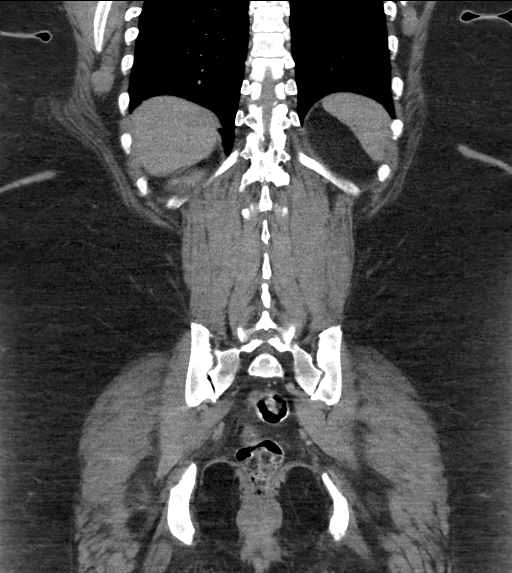

[14 of 46 positions shown; findings below may reference images not displayed]

FINDINGS: Lower chest: Lung bases are clear.

Hepatobiliary: No focal hepatic lesion. No biliary duct dilatation.
Gallbladder is normal. Common bile duct is normal.

Pancreas: Pancreas is normal. No ductal dilatation. No pancreatic
inflammation.

Spleen: Normal spleen

Adrenals/urinary tract: Adrenal glands and kidneys are normal. The
ureters and bladder normal.

Stomach/Bowel: Stomach, small-bowel, and cecum normal. Ascending and
transverse colon normal. There is a short segment inflammation
involving the mid sigmoid colon spanning approximately 6 cm (image
76/2). There is an inflamed diverticulum through this region of
pericolonic fat inflammation. No perforation or abscess. Rectum
normal

Vascular/Lymphatic: Abdominal aorta is normal caliber. No periportal
or retroperitoneal adenopathy. No pelvic adenopathy.

Reproductive: Uterus normal. 3.7 cm intermediate density cyst of the
LEFT ovary.

Other: No free fluid.

Musculoskeletal: No aggressive osseous lesion.
IMPRESSION: 1. Acute diverticulitis of the sigmoid colon. No perforation or
abscess.
2. IUD in expected location.
3. A probable benign 3.7 cm cyst of the LEFT ovary likely represents
a functional ovarian cyst or hemorrhagic cyst. Recommend follow-up
pelvic ultrasound 6 to 12 weeks to confirm resolution. This
recommendation follows ACR consensus guidelines: White Paper of the
ACR Incidental Findings Committee II on Adnexal Findings. [HOSPITAL] [DATE].
